# Patient Record
Sex: Male | Born: 1935 | Smoking: Former smoker
Health system: Southern US, Community
[De-identification: ages and names within clinical notes are randomized; demographics above are authoritative.]

## PROBLEM LIST (undated history)

## (undated) DIAGNOSIS — I10 Essential (primary) hypertension: Secondary | ICD-10-CM

## (undated) DIAGNOSIS — I350 Nonrheumatic aortic (valve) stenosis: Secondary | ICD-10-CM

## (undated) DIAGNOSIS — E785 Hyperlipidemia, unspecified: Secondary | ICD-10-CM

## (undated) DIAGNOSIS — E119 Type 2 diabetes mellitus without complications: Secondary | ICD-10-CM

## (undated) DIAGNOSIS — F0390 Unspecified dementia without behavioral disturbance: Secondary | ICD-10-CM

## (undated) DIAGNOSIS — I509 Heart failure, unspecified: Secondary | ICD-10-CM

## (undated) DIAGNOSIS — I4891 Unspecified atrial fibrillation: Secondary | ICD-10-CM

## (undated) HISTORY — PX: APPENDECTOMY: SHX54

## (undated) HISTORY — PX: ELBOW FRACTURE SURGERY: SHX616

## (undated) HISTORY — PX: CHOLECYSTECTOMY: SHX55

---

## 2011-02-08 DIAGNOSIS — L609 Nail disorder, unspecified: Secondary | ICD-10-CM | POA: Diagnosis not present

## 2011-02-08 DIAGNOSIS — M79609 Pain in unspecified limb: Secondary | ICD-10-CM | POA: Diagnosis not present

## 2011-02-09 DIAGNOSIS — E785 Hyperlipidemia, unspecified: Secondary | ICD-10-CM | POA: Diagnosis not present

## 2011-02-09 DIAGNOSIS — E119 Type 2 diabetes mellitus without complications: Secondary | ICD-10-CM | POA: Diagnosis not present

## 2011-02-09 DIAGNOSIS — Z6834 Body mass index (BMI) 34.0-34.9, adult: Secondary | ICD-10-CM | POA: Diagnosis not present

## 2011-02-09 DIAGNOSIS — I10 Essential (primary) hypertension: Secondary | ICD-10-CM | POA: Diagnosis not present

## 2011-02-09 DIAGNOSIS — Z79899 Other long term (current) drug therapy: Secondary | ICD-10-CM | POA: Diagnosis not present

## 2011-04-21 DIAGNOSIS — I4891 Unspecified atrial fibrillation: Secondary | ICD-10-CM | POA: Diagnosis not present

## 2011-04-21 DIAGNOSIS — R0602 Shortness of breath: Secondary | ICD-10-CM | POA: Diagnosis not present

## 2011-04-21 DIAGNOSIS — R0609 Other forms of dyspnea: Secondary | ICD-10-CM | POA: Diagnosis not present

## 2011-04-21 DIAGNOSIS — I509 Heart failure, unspecified: Secondary | ICD-10-CM | POA: Diagnosis not present

## 2011-04-21 DIAGNOSIS — Z6834 Body mass index (BMI) 34.0-34.9, adult: Secondary | ICD-10-CM | POA: Diagnosis not present

## 2011-04-28 DIAGNOSIS — J209 Acute bronchitis, unspecified: Secondary | ICD-10-CM | POA: Diagnosis not present

## 2011-04-28 DIAGNOSIS — Z6834 Body mass index (BMI) 34.0-34.9, adult: Secondary | ICD-10-CM | POA: Diagnosis not present

## 2011-05-03 DIAGNOSIS — M79609 Pain in unspecified limb: Secondary | ICD-10-CM | POA: Diagnosis not present

## 2011-05-03 DIAGNOSIS — L600 Ingrowing nail: Secondary | ICD-10-CM | POA: Diagnosis not present

## 2011-05-27 DIAGNOSIS — E119 Type 2 diabetes mellitus without complications: Secondary | ICD-10-CM | POA: Diagnosis not present

## 2011-05-27 DIAGNOSIS — I10 Essential (primary) hypertension: Secondary | ICD-10-CM | POA: Diagnosis not present

## 2011-05-27 DIAGNOSIS — E785 Hyperlipidemia, unspecified: Secondary | ICD-10-CM | POA: Diagnosis not present

## 2011-05-27 DIAGNOSIS — Z6834 Body mass index (BMI) 34.0-34.9, adult: Secondary | ICD-10-CM | POA: Diagnosis not present

## 2011-08-02 DIAGNOSIS — M79609 Pain in unspecified limb: Secondary | ICD-10-CM | POA: Diagnosis not present

## 2011-08-02 DIAGNOSIS — L600 Ingrowing nail: Secondary | ICD-10-CM | POA: Diagnosis not present

## 2011-09-09 DIAGNOSIS — F0280 Dementia in other diseases classified elsewhere without behavioral disturbance: Secondary | ICD-10-CM | POA: Diagnosis not present

## 2011-09-09 DIAGNOSIS — E785 Hyperlipidemia, unspecified: Secondary | ICD-10-CM | POA: Diagnosis not present

## 2011-09-09 DIAGNOSIS — I10 Essential (primary) hypertension: Secondary | ICD-10-CM | POA: Diagnosis not present

## 2011-09-09 DIAGNOSIS — E119 Type 2 diabetes mellitus without complications: Secondary | ICD-10-CM | POA: Diagnosis not present

## 2011-09-26 DIAGNOSIS — I509 Heart failure, unspecified: Secondary | ICD-10-CM | POA: Diagnosis not present

## 2011-09-26 DIAGNOSIS — E785 Hyperlipidemia, unspecified: Secondary | ICD-10-CM | POA: Diagnosis not present

## 2011-09-26 DIAGNOSIS — E119 Type 2 diabetes mellitus without complications: Secondary | ICD-10-CM | POA: Diagnosis not present

## 2011-09-26 DIAGNOSIS — I10 Essential (primary) hypertension: Secondary | ICD-10-CM | POA: Diagnosis not present

## 2011-10-10 DIAGNOSIS — I509 Heart failure, unspecified: Secondary | ICD-10-CM | POA: Diagnosis not present

## 2011-10-10 DIAGNOSIS — I10 Essential (primary) hypertension: Secondary | ICD-10-CM | POA: Diagnosis not present

## 2011-10-10 DIAGNOSIS — E119 Type 2 diabetes mellitus without complications: Secondary | ICD-10-CM | POA: Diagnosis not present

## 2011-10-10 DIAGNOSIS — Z23 Encounter for immunization: Secondary | ICD-10-CM | POA: Diagnosis not present

## 2011-10-25 DIAGNOSIS — L600 Ingrowing nail: Secondary | ICD-10-CM | POA: Diagnosis not present

## 2011-10-25 DIAGNOSIS — M79609 Pain in unspecified limb: Secondary | ICD-10-CM | POA: Diagnosis not present

## 2011-11-28 DIAGNOSIS — I10 Essential (primary) hypertension: Secondary | ICD-10-CM | POA: Diagnosis not present

## 2011-11-28 DIAGNOSIS — I4891 Unspecified atrial fibrillation: Secondary | ICD-10-CM | POA: Diagnosis not present

## 2011-11-28 DIAGNOSIS — F0390 Unspecified dementia without behavioral disturbance: Secondary | ICD-10-CM | POA: Diagnosis not present

## 2011-11-28 DIAGNOSIS — I509 Heart failure, unspecified: Secondary | ICD-10-CM | POA: Diagnosis not present

## 2011-12-06 DIAGNOSIS — I10 Essential (primary) hypertension: Secondary | ICD-10-CM | POA: Diagnosis not present

## 2011-12-06 DIAGNOSIS — I5022 Chronic systolic (congestive) heart failure: Secondary | ICD-10-CM | POA: Diagnosis not present

## 2011-12-06 DIAGNOSIS — E119 Type 2 diabetes mellitus without complications: Secondary | ICD-10-CM | POA: Diagnosis not present

## 2011-12-06 DIAGNOSIS — I4891 Unspecified atrial fibrillation: Secondary | ICD-10-CM | POA: Diagnosis not present

## 2012-01-10 DIAGNOSIS — F0390 Unspecified dementia without behavioral disturbance: Secondary | ICD-10-CM | POA: Diagnosis not present

## 2012-01-10 DIAGNOSIS — I10 Essential (primary) hypertension: Secondary | ICD-10-CM | POA: Diagnosis not present

## 2012-01-10 DIAGNOSIS — E119 Type 2 diabetes mellitus without complications: Secondary | ICD-10-CM | POA: Diagnosis not present

## 2012-01-10 DIAGNOSIS — E785 Hyperlipidemia, unspecified: Secondary | ICD-10-CM | POA: Diagnosis not present

## 2012-01-23 DIAGNOSIS — I509 Heart failure, unspecified: Secondary | ICD-10-CM | POA: Diagnosis not present

## 2012-01-23 DIAGNOSIS — I4891 Unspecified atrial fibrillation: Secondary | ICD-10-CM | POA: Diagnosis not present

## 2012-01-23 DIAGNOSIS — E119 Type 2 diabetes mellitus without complications: Secondary | ICD-10-CM | POA: Diagnosis not present

## 2012-01-23 DIAGNOSIS — F0390 Unspecified dementia without behavioral disturbance: Secondary | ICD-10-CM | POA: Diagnosis not present

## 2012-01-24 DIAGNOSIS — M79609 Pain in unspecified limb: Secondary | ICD-10-CM | POA: Diagnosis not present

## 2012-01-24 DIAGNOSIS — L600 Ingrowing nail: Secondary | ICD-10-CM | POA: Diagnosis not present

## 2012-02-03 DIAGNOSIS — D485 Neoplasm of uncertain behavior of skin: Secondary | ICD-10-CM | POA: Diagnosis not present

## 2012-04-09 DIAGNOSIS — E785 Hyperlipidemia, unspecified: Secondary | ICD-10-CM | POA: Diagnosis not present

## 2012-04-09 DIAGNOSIS — F0390 Unspecified dementia without behavioral disturbance: Secondary | ICD-10-CM | POA: Diagnosis not present

## 2012-04-09 DIAGNOSIS — I10 Essential (primary) hypertension: Secondary | ICD-10-CM | POA: Diagnosis not present

## 2012-04-09 DIAGNOSIS — E119 Type 2 diabetes mellitus without complications: Secondary | ICD-10-CM | POA: Diagnosis not present

## 2012-06-07 DIAGNOSIS — E785 Hyperlipidemia, unspecified: Secondary | ICD-10-CM | POA: Diagnosis not present

## 2012-06-07 DIAGNOSIS — E119 Type 2 diabetes mellitus without complications: Secondary | ICD-10-CM | POA: Diagnosis not present

## 2012-06-07 DIAGNOSIS — I10 Essential (primary) hypertension: Secondary | ICD-10-CM | POA: Diagnosis not present

## 2012-06-07 DIAGNOSIS — F0390 Unspecified dementia without behavioral disturbance: Secondary | ICD-10-CM | POA: Diagnosis not present

## 2012-06-12 DIAGNOSIS — F0390 Unspecified dementia without behavioral disturbance: Secondary | ICD-10-CM | POA: Diagnosis not present

## 2012-07-16 DIAGNOSIS — I10 Essential (primary) hypertension: Secondary | ICD-10-CM | POA: Diagnosis not present

## 2012-07-16 DIAGNOSIS — E785 Hyperlipidemia, unspecified: Secondary | ICD-10-CM | POA: Diagnosis not present

## 2012-07-16 DIAGNOSIS — E119 Type 2 diabetes mellitus without complications: Secondary | ICD-10-CM | POA: Diagnosis not present

## 2012-07-16 DIAGNOSIS — F0390 Unspecified dementia without behavioral disturbance: Secondary | ICD-10-CM | POA: Diagnosis not present

## 2012-08-07 DIAGNOSIS — L600 Ingrowing nail: Secondary | ICD-10-CM | POA: Diagnosis not present

## 2012-08-07 DIAGNOSIS — M79609 Pain in unspecified limb: Secondary | ICD-10-CM | POA: Diagnosis not present

## 2012-09-10 DIAGNOSIS — E119 Type 2 diabetes mellitus without complications: Secondary | ICD-10-CM | POA: Diagnosis not present

## 2012-09-19 DIAGNOSIS — E785 Hyperlipidemia, unspecified: Secondary | ICD-10-CM | POA: Diagnosis not present

## 2012-09-19 DIAGNOSIS — I10 Essential (primary) hypertension: Secondary | ICD-10-CM | POA: Diagnosis not present

## 2012-09-19 DIAGNOSIS — Z23 Encounter for immunization: Secondary | ICD-10-CM | POA: Diagnosis not present

## 2012-09-19 DIAGNOSIS — D518 Other vitamin B12 deficiency anemias: Secondary | ICD-10-CM | POA: Diagnosis not present

## 2012-09-19 DIAGNOSIS — E119 Type 2 diabetes mellitus without complications: Secondary | ICD-10-CM | POA: Diagnosis not present

## 2012-09-21 DIAGNOSIS — I10 Essential (primary) hypertension: Secondary | ICD-10-CM | POA: Diagnosis not present

## 2012-09-21 DIAGNOSIS — I509 Heart failure, unspecified: Secondary | ICD-10-CM | POA: Diagnosis not present

## 2012-09-21 DIAGNOSIS — E119 Type 2 diabetes mellitus without complications: Secondary | ICD-10-CM | POA: Diagnosis not present

## 2012-09-21 DIAGNOSIS — D510 Vitamin B12 deficiency anemia due to intrinsic factor deficiency: Secondary | ICD-10-CM | POA: Diagnosis not present

## 2012-11-06 DIAGNOSIS — M79609 Pain in unspecified limb: Secondary | ICD-10-CM | POA: Diagnosis not present

## 2012-11-06 DIAGNOSIS — L600 Ingrowing nail: Secondary | ICD-10-CM | POA: Diagnosis not present

## 2012-11-19 DIAGNOSIS — F0390 Unspecified dementia without behavioral disturbance: Secondary | ICD-10-CM | POA: Diagnosis not present

## 2012-11-19 DIAGNOSIS — I10 Essential (primary) hypertension: Secondary | ICD-10-CM | POA: Diagnosis not present

## 2012-11-20 DIAGNOSIS — I509 Heart failure, unspecified: Secondary | ICD-10-CM | POA: Diagnosis not present

## 2012-11-20 DIAGNOSIS — D510 Vitamin B12 deficiency anemia due to intrinsic factor deficiency: Secondary | ICD-10-CM | POA: Diagnosis not present

## 2012-11-20 DIAGNOSIS — I10 Essential (primary) hypertension: Secondary | ICD-10-CM | POA: Diagnosis not present

## 2012-11-20 DIAGNOSIS — E119 Type 2 diabetes mellitus without complications: Secondary | ICD-10-CM | POA: Diagnosis not present

## 2012-12-11 DIAGNOSIS — S42213A Unspecified displaced fracture of surgical neck of unspecified humerus, initial encounter for closed fracture: Secondary | ICD-10-CM | POA: Diagnosis not present

## 2012-12-11 DIAGNOSIS — R609 Edema, unspecified: Secondary | ICD-10-CM | POA: Diagnosis not present

## 2012-12-11 DIAGNOSIS — S52009A Unspecified fracture of upper end of unspecified ulna, initial encounter for closed fracture: Secondary | ICD-10-CM | POA: Diagnosis not present

## 2012-12-11 DIAGNOSIS — W010XXA Fall on same level from slipping, tripping and stumbling without subsequent striking against object, initial encounter: Secondary | ICD-10-CM | POA: Diagnosis not present

## 2012-12-11 DIAGNOSIS — S42409A Unspecified fracture of lower end of unspecified humerus, initial encounter for closed fracture: Secondary | ICD-10-CM | POA: Diagnosis not present

## 2012-12-11 DIAGNOSIS — M79609 Pain in unspecified limb: Secondary | ICD-10-CM | POA: Diagnosis not present

## 2012-12-12 DIAGNOSIS — S52023A Displaced fracture of olecranon process without intraarticular extension of unspecified ulna, initial encounter for closed fracture: Secondary | ICD-10-CM | POA: Diagnosis not present

## 2012-12-12 DIAGNOSIS — E119 Type 2 diabetes mellitus without complications: Secondary | ICD-10-CM | POA: Diagnosis not present

## 2012-12-13 DIAGNOSIS — F0390 Unspecified dementia without behavioral disturbance: Secondary | ICD-10-CM | POA: Diagnosis not present

## 2012-12-13 DIAGNOSIS — S52023A Displaced fracture of olecranon process without intraarticular extension of unspecified ulna, initial encounter for closed fracture: Secondary | ICD-10-CM | POA: Diagnosis not present

## 2012-12-24 DIAGNOSIS — F0390 Unspecified dementia without behavioral disturbance: Secondary | ICD-10-CM | POA: Diagnosis not present

## 2012-12-24 DIAGNOSIS — R0989 Other specified symptoms and signs involving the circulatory and respiratory systems: Secondary | ICD-10-CM | POA: Diagnosis not present

## 2012-12-24 DIAGNOSIS — I10 Essential (primary) hypertension: Secondary | ICD-10-CM | POA: Diagnosis not present

## 2012-12-24 DIAGNOSIS — R0609 Other forms of dyspnea: Secondary | ICD-10-CM | POA: Diagnosis not present

## 2012-12-24 DIAGNOSIS — E785 Hyperlipidemia, unspecified: Secondary | ICD-10-CM | POA: Diagnosis not present

## 2012-12-24 DIAGNOSIS — I509 Heart failure, unspecified: Secondary | ICD-10-CM | POA: Diagnosis not present

## 2012-12-24 DIAGNOSIS — E119 Type 2 diabetes mellitus without complications: Secondary | ICD-10-CM | POA: Diagnosis not present

## 2012-12-26 DIAGNOSIS — I10 Essential (primary) hypertension: Secondary | ICD-10-CM | POA: Diagnosis not present

## 2012-12-26 DIAGNOSIS — I509 Heart failure, unspecified: Secondary | ICD-10-CM | POA: Diagnosis not present

## 2012-12-28 DIAGNOSIS — I10 Essential (primary) hypertension: Secondary | ICD-10-CM | POA: Diagnosis not present

## 2012-12-31 DIAGNOSIS — I10 Essential (primary) hypertension: Secondary | ICD-10-CM | POA: Diagnosis not present

## 2012-12-31 DIAGNOSIS — I509 Heart failure, unspecified: Secondary | ICD-10-CM | POA: Diagnosis not present

## 2013-01-04 DIAGNOSIS — I509 Heart failure, unspecified: Secondary | ICD-10-CM | POA: Diagnosis not present

## 2013-01-04 DIAGNOSIS — I10 Essential (primary) hypertension: Secondary | ICD-10-CM | POA: Diagnosis not present

## 2013-01-05 DIAGNOSIS — E875 Hyperkalemia: Secondary | ICD-10-CM | POA: Diagnosis not present

## 2013-01-07 DIAGNOSIS — S52123A Displaced fracture of head of unspecified radius, initial encounter for closed fracture: Secondary | ICD-10-CM | POA: Diagnosis not present

## 2013-01-07 DIAGNOSIS — S52023A Displaced fracture of olecranon process without intraarticular extension of unspecified ulna, initial encounter for closed fracture: Secondary | ICD-10-CM | POA: Diagnosis not present

## 2013-01-08 DIAGNOSIS — G20 Parkinson's disease: Secondary | ICD-10-CM | POA: Diagnosis not present

## 2013-01-08 DIAGNOSIS — E119 Type 2 diabetes mellitus without complications: Secondary | ICD-10-CM | POA: Diagnosis not present

## 2013-01-08 DIAGNOSIS — I4891 Unspecified atrial fibrillation: Secondary | ICD-10-CM | POA: Diagnosis not present

## 2013-01-08 DIAGNOSIS — R9431 Abnormal electrocardiogram [ECG] [EKG]: Secondary | ICD-10-CM | POA: Diagnosis not present

## 2013-01-08 DIAGNOSIS — Z7982 Long term (current) use of aspirin: Secondary | ICD-10-CM | POA: Diagnosis not present

## 2013-01-08 DIAGNOSIS — F0390 Unspecified dementia without behavioral disturbance: Secondary | ICD-10-CM | POA: Diagnosis not present

## 2013-01-08 DIAGNOSIS — S52023A Displaced fracture of olecranon process without intraarticular extension of unspecified ulna, initial encounter for closed fracture: Secondary | ICD-10-CM | POA: Diagnosis not present

## 2013-01-09 DIAGNOSIS — S52023A Displaced fracture of olecranon process without intraarticular extension of unspecified ulna, initial encounter for closed fracture: Secondary | ICD-10-CM | POA: Diagnosis not present

## 2013-01-09 DIAGNOSIS — S52133A Displaced fracture of neck of unspecified radius, initial encounter for closed fracture: Secondary | ICD-10-CM | POA: Diagnosis not present

## 2013-01-09 DIAGNOSIS — I4891 Unspecified atrial fibrillation: Secondary | ICD-10-CM | POA: Diagnosis not present

## 2013-01-09 DIAGNOSIS — G20 Parkinson's disease: Secondary | ICD-10-CM | POA: Diagnosis not present

## 2013-01-09 DIAGNOSIS — E119 Type 2 diabetes mellitus without complications: Secondary | ICD-10-CM | POA: Diagnosis not present

## 2013-01-09 DIAGNOSIS — Z7982 Long term (current) use of aspirin: Secondary | ICD-10-CM | POA: Diagnosis not present

## 2013-01-09 DIAGNOSIS — S52123A Displaced fracture of head of unspecified radius, initial encounter for closed fracture: Secondary | ICD-10-CM | POA: Diagnosis not present

## 2013-01-09 DIAGNOSIS — F0390 Unspecified dementia without behavioral disturbance: Secondary | ICD-10-CM | POA: Diagnosis not present

## 2013-01-10 DIAGNOSIS — L2089 Other atopic dermatitis: Secondary | ICD-10-CM | POA: Diagnosis not present

## 2013-01-10 DIAGNOSIS — F0390 Unspecified dementia without behavioral disturbance: Secondary | ICD-10-CM | POA: Diagnosis not present

## 2013-01-10 DIAGNOSIS — L299 Pruritus, unspecified: Secondary | ICD-10-CM | POA: Diagnosis not present

## 2013-01-10 DIAGNOSIS — S52023A Displaced fracture of olecranon process without intraarticular extension of unspecified ulna, initial encounter for closed fracture: Secondary | ICD-10-CM | POA: Diagnosis not present

## 2013-01-10 DIAGNOSIS — IMO0002 Reserved for concepts with insufficient information to code with codable children: Secondary | ICD-10-CM | POA: Diagnosis not present

## 2013-01-19 DIAGNOSIS — E119 Type 2 diabetes mellitus without complications: Secondary | ICD-10-CM | POA: Diagnosis not present

## 2013-01-19 DIAGNOSIS — D510 Vitamin B12 deficiency anemia due to intrinsic factor deficiency: Secondary | ICD-10-CM | POA: Diagnosis not present

## 2013-01-19 DIAGNOSIS — I509 Heart failure, unspecified: Secondary | ICD-10-CM | POA: Diagnosis not present

## 2013-01-19 DIAGNOSIS — I10 Essential (primary) hypertension: Secondary | ICD-10-CM | POA: Diagnosis not present

## 2013-01-19 DIAGNOSIS — IMO0001 Reserved for inherently not codable concepts without codable children: Secondary | ICD-10-CM | POA: Diagnosis not present

## 2013-01-21 DIAGNOSIS — S52023A Displaced fracture of olecranon process without intraarticular extension of unspecified ulna, initial encounter for closed fracture: Secondary | ICD-10-CM | POA: Diagnosis not present

## 2013-01-22 DIAGNOSIS — I10 Essential (primary) hypertension: Secondary | ICD-10-CM | POA: Diagnosis not present

## 2013-01-22 DIAGNOSIS — E119 Type 2 diabetes mellitus without complications: Secondary | ICD-10-CM | POA: Diagnosis not present

## 2013-01-22 DIAGNOSIS — IMO0001 Reserved for inherently not codable concepts without codable children: Secondary | ICD-10-CM | POA: Diagnosis not present

## 2013-01-22 DIAGNOSIS — D510 Vitamin B12 deficiency anemia due to intrinsic factor deficiency: Secondary | ICD-10-CM | POA: Diagnosis not present

## 2013-01-22 DIAGNOSIS — I509 Heart failure, unspecified: Secondary | ICD-10-CM | POA: Diagnosis not present

## 2013-01-23 DIAGNOSIS — D510 Vitamin B12 deficiency anemia due to intrinsic factor deficiency: Secondary | ICD-10-CM | POA: Diagnosis not present

## 2013-01-23 DIAGNOSIS — IMO0001 Reserved for inherently not codable concepts without codable children: Secondary | ICD-10-CM | POA: Diagnosis not present

## 2013-01-23 DIAGNOSIS — I10 Essential (primary) hypertension: Secondary | ICD-10-CM | POA: Diagnosis not present

## 2013-01-23 DIAGNOSIS — I509 Heart failure, unspecified: Secondary | ICD-10-CM | POA: Diagnosis not present

## 2013-01-23 DIAGNOSIS — E119 Type 2 diabetes mellitus without complications: Secondary | ICD-10-CM | POA: Diagnosis not present

## 2013-01-24 DIAGNOSIS — I10 Essential (primary) hypertension: Secondary | ICD-10-CM | POA: Diagnosis not present

## 2013-01-24 DIAGNOSIS — E119 Type 2 diabetes mellitus without complications: Secondary | ICD-10-CM | POA: Diagnosis not present

## 2013-01-24 DIAGNOSIS — D510 Vitamin B12 deficiency anemia due to intrinsic factor deficiency: Secondary | ICD-10-CM | POA: Diagnosis not present

## 2013-01-24 DIAGNOSIS — IMO0001 Reserved for inherently not codable concepts without codable children: Secondary | ICD-10-CM | POA: Diagnosis not present

## 2013-01-24 DIAGNOSIS — I509 Heart failure, unspecified: Secondary | ICD-10-CM | POA: Diagnosis not present

## 2013-01-28 DIAGNOSIS — D510 Vitamin B12 deficiency anemia due to intrinsic factor deficiency: Secondary | ICD-10-CM | POA: Diagnosis not present

## 2013-01-28 DIAGNOSIS — I509 Heart failure, unspecified: Secondary | ICD-10-CM | POA: Diagnosis not present

## 2013-01-28 DIAGNOSIS — E119 Type 2 diabetes mellitus without complications: Secondary | ICD-10-CM | POA: Diagnosis not present

## 2013-01-28 DIAGNOSIS — I10 Essential (primary) hypertension: Secondary | ICD-10-CM | POA: Diagnosis not present

## 2013-01-28 DIAGNOSIS — IMO0001 Reserved for inherently not codable concepts without codable children: Secondary | ICD-10-CM | POA: Diagnosis not present

## 2013-01-29 DIAGNOSIS — I509 Heart failure, unspecified: Secondary | ICD-10-CM | POA: Diagnosis not present

## 2013-01-29 DIAGNOSIS — E119 Type 2 diabetes mellitus without complications: Secondary | ICD-10-CM | POA: Diagnosis not present

## 2013-01-29 DIAGNOSIS — I10 Essential (primary) hypertension: Secondary | ICD-10-CM | POA: Diagnosis not present

## 2013-01-29 DIAGNOSIS — IMO0001 Reserved for inherently not codable concepts without codable children: Secondary | ICD-10-CM | POA: Diagnosis not present

## 2013-01-29 DIAGNOSIS — D510 Vitamin B12 deficiency anemia due to intrinsic factor deficiency: Secondary | ICD-10-CM | POA: Diagnosis not present

## 2013-01-30 DIAGNOSIS — IMO0001 Reserved for inherently not codable concepts without codable children: Secondary | ICD-10-CM | POA: Diagnosis not present

## 2013-01-30 DIAGNOSIS — D510 Vitamin B12 deficiency anemia due to intrinsic factor deficiency: Secondary | ICD-10-CM | POA: Diagnosis not present

## 2013-01-30 DIAGNOSIS — E119 Type 2 diabetes mellitus without complications: Secondary | ICD-10-CM | POA: Diagnosis not present

## 2013-01-30 DIAGNOSIS — I10 Essential (primary) hypertension: Secondary | ICD-10-CM | POA: Diagnosis not present

## 2013-01-30 DIAGNOSIS — I509 Heart failure, unspecified: Secondary | ICD-10-CM | POA: Diagnosis not present

## 2013-01-31 DIAGNOSIS — IMO0001 Reserved for inherently not codable concepts without codable children: Secondary | ICD-10-CM | POA: Diagnosis not present

## 2013-01-31 DIAGNOSIS — I10 Essential (primary) hypertension: Secondary | ICD-10-CM | POA: Diagnosis not present

## 2013-01-31 DIAGNOSIS — E119 Type 2 diabetes mellitus without complications: Secondary | ICD-10-CM | POA: Diagnosis not present

## 2013-01-31 DIAGNOSIS — D510 Vitamin B12 deficiency anemia due to intrinsic factor deficiency: Secondary | ICD-10-CM | POA: Diagnosis not present

## 2013-01-31 DIAGNOSIS — I509 Heart failure, unspecified: Secondary | ICD-10-CM | POA: Diagnosis not present

## 2013-02-05 DIAGNOSIS — E119 Type 2 diabetes mellitus without complications: Secondary | ICD-10-CM | POA: Diagnosis not present

## 2013-02-05 DIAGNOSIS — D510 Vitamin B12 deficiency anemia due to intrinsic factor deficiency: Secondary | ICD-10-CM | POA: Diagnosis not present

## 2013-02-05 DIAGNOSIS — I10 Essential (primary) hypertension: Secondary | ICD-10-CM | POA: Diagnosis not present

## 2013-02-05 DIAGNOSIS — M79609 Pain in unspecified limb: Secondary | ICD-10-CM | POA: Diagnosis not present

## 2013-02-05 DIAGNOSIS — I509 Heart failure, unspecified: Secondary | ICD-10-CM | POA: Diagnosis not present

## 2013-02-05 DIAGNOSIS — L600 Ingrowing nail: Secondary | ICD-10-CM | POA: Diagnosis not present

## 2013-02-05 DIAGNOSIS — IMO0001 Reserved for inherently not codable concepts without codable children: Secondary | ICD-10-CM | POA: Diagnosis not present

## 2013-02-07 DIAGNOSIS — I359 Nonrheumatic aortic valve disorder, unspecified: Secondary | ICD-10-CM | POA: Diagnosis not present

## 2013-02-07 DIAGNOSIS — D510 Vitamin B12 deficiency anemia due to intrinsic factor deficiency: Secondary | ICD-10-CM | POA: Diagnosis not present

## 2013-02-07 DIAGNOSIS — IMO0001 Reserved for inherently not codable concepts without codable children: Secondary | ICD-10-CM | POA: Diagnosis not present

## 2013-02-07 DIAGNOSIS — L259 Unspecified contact dermatitis, unspecified cause: Secondary | ICD-10-CM | POA: Diagnosis not present

## 2013-02-07 DIAGNOSIS — E785 Hyperlipidemia, unspecified: Secondary | ICD-10-CM | POA: Diagnosis not present

## 2013-02-07 DIAGNOSIS — L981 Factitial dermatitis: Secondary | ICD-10-CM | POA: Diagnosis not present

## 2013-02-07 DIAGNOSIS — E119 Type 2 diabetes mellitus without complications: Secondary | ICD-10-CM | POA: Diagnosis not present

## 2013-02-07 DIAGNOSIS — I10 Essential (primary) hypertension: Secondary | ICD-10-CM | POA: Diagnosis not present

## 2013-02-07 DIAGNOSIS — I509 Heart failure, unspecified: Secondary | ICD-10-CM | POA: Diagnosis not present

## 2013-02-11 DIAGNOSIS — IMO0001 Reserved for inherently not codable concepts without codable children: Secondary | ICD-10-CM | POA: Diagnosis not present

## 2013-02-11 DIAGNOSIS — E119 Type 2 diabetes mellitus without complications: Secondary | ICD-10-CM | POA: Diagnosis not present

## 2013-02-11 DIAGNOSIS — I509 Heart failure, unspecified: Secondary | ICD-10-CM | POA: Diagnosis not present

## 2013-02-11 DIAGNOSIS — D510 Vitamin B12 deficiency anemia due to intrinsic factor deficiency: Secondary | ICD-10-CM | POA: Diagnosis not present

## 2013-02-11 DIAGNOSIS — I10 Essential (primary) hypertension: Secondary | ICD-10-CM | POA: Diagnosis not present

## 2013-02-12 DIAGNOSIS — IMO0001 Reserved for inherently not codable concepts without codable children: Secondary | ICD-10-CM | POA: Diagnosis not present

## 2013-02-12 DIAGNOSIS — E119 Type 2 diabetes mellitus without complications: Secondary | ICD-10-CM | POA: Diagnosis not present

## 2013-02-12 DIAGNOSIS — D510 Vitamin B12 deficiency anemia due to intrinsic factor deficiency: Secondary | ICD-10-CM | POA: Diagnosis not present

## 2013-02-12 DIAGNOSIS — I509 Heart failure, unspecified: Secondary | ICD-10-CM | POA: Diagnosis not present

## 2013-02-12 DIAGNOSIS — I10 Essential (primary) hypertension: Secondary | ICD-10-CM | POA: Diagnosis not present

## 2013-02-14 DIAGNOSIS — IMO0001 Reserved for inherently not codable concepts without codable children: Secondary | ICD-10-CM | POA: Diagnosis not present

## 2013-02-14 DIAGNOSIS — E119 Type 2 diabetes mellitus without complications: Secondary | ICD-10-CM | POA: Diagnosis not present

## 2013-02-14 DIAGNOSIS — D510 Vitamin B12 deficiency anemia due to intrinsic factor deficiency: Secondary | ICD-10-CM | POA: Diagnosis not present

## 2013-02-14 DIAGNOSIS — I10 Essential (primary) hypertension: Secondary | ICD-10-CM | POA: Diagnosis not present

## 2013-02-14 DIAGNOSIS — I509 Heart failure, unspecified: Secondary | ICD-10-CM | POA: Diagnosis not present

## 2013-02-17 DIAGNOSIS — D510 Vitamin B12 deficiency anemia due to intrinsic factor deficiency: Secondary | ICD-10-CM | POA: Diagnosis not present

## 2013-02-17 DIAGNOSIS — I10 Essential (primary) hypertension: Secondary | ICD-10-CM | POA: Diagnosis not present

## 2013-02-17 DIAGNOSIS — E119 Type 2 diabetes mellitus without complications: Secondary | ICD-10-CM | POA: Diagnosis not present

## 2013-02-17 DIAGNOSIS — I509 Heart failure, unspecified: Secondary | ICD-10-CM | POA: Diagnosis not present

## 2013-02-17 DIAGNOSIS — IMO0001 Reserved for inherently not codable concepts without codable children: Secondary | ICD-10-CM | POA: Diagnosis not present

## 2013-02-18 DIAGNOSIS — I509 Heart failure, unspecified: Secondary | ICD-10-CM | POA: Diagnosis not present

## 2013-02-18 DIAGNOSIS — IMO0001 Reserved for inherently not codable concepts without codable children: Secondary | ICD-10-CM | POA: Diagnosis not present

## 2013-02-18 DIAGNOSIS — E119 Type 2 diabetes mellitus without complications: Secondary | ICD-10-CM | POA: Diagnosis not present

## 2013-02-18 DIAGNOSIS — D510 Vitamin B12 deficiency anemia due to intrinsic factor deficiency: Secondary | ICD-10-CM | POA: Diagnosis not present

## 2013-02-18 DIAGNOSIS — S52023A Displaced fracture of olecranon process without intraarticular extension of unspecified ulna, initial encounter for closed fracture: Secondary | ICD-10-CM | POA: Diagnosis not present

## 2013-02-18 DIAGNOSIS — I10 Essential (primary) hypertension: Secondary | ICD-10-CM | POA: Diagnosis not present

## 2013-02-20 DIAGNOSIS — I10 Essential (primary) hypertension: Secondary | ICD-10-CM | POA: Diagnosis not present

## 2013-02-20 DIAGNOSIS — F0390 Unspecified dementia without behavioral disturbance: Secondary | ICD-10-CM | POA: Diagnosis not present

## 2013-02-21 DIAGNOSIS — D510 Vitamin B12 deficiency anemia due to intrinsic factor deficiency: Secondary | ICD-10-CM | POA: Diagnosis not present

## 2013-02-21 DIAGNOSIS — IMO0001 Reserved for inherently not codable concepts without codable children: Secondary | ICD-10-CM | POA: Diagnosis not present

## 2013-02-21 DIAGNOSIS — I10 Essential (primary) hypertension: Secondary | ICD-10-CM | POA: Diagnosis not present

## 2013-02-21 DIAGNOSIS — I509 Heart failure, unspecified: Secondary | ICD-10-CM | POA: Diagnosis not present

## 2013-02-21 DIAGNOSIS — E119 Type 2 diabetes mellitus without complications: Secondary | ICD-10-CM | POA: Diagnosis not present

## 2013-02-25 DIAGNOSIS — I10 Essential (primary) hypertension: Secondary | ICD-10-CM | POA: Diagnosis not present

## 2013-02-25 DIAGNOSIS — E119 Type 2 diabetes mellitus without complications: Secondary | ICD-10-CM | POA: Diagnosis not present

## 2013-02-25 DIAGNOSIS — I509 Heart failure, unspecified: Secondary | ICD-10-CM | POA: Diagnosis not present

## 2013-02-25 DIAGNOSIS — D510 Vitamin B12 deficiency anemia due to intrinsic factor deficiency: Secondary | ICD-10-CM | POA: Diagnosis not present

## 2013-02-25 DIAGNOSIS — IMO0001 Reserved for inherently not codable concepts without codable children: Secondary | ICD-10-CM | POA: Diagnosis not present

## 2013-02-26 DIAGNOSIS — E119 Type 2 diabetes mellitus without complications: Secondary | ICD-10-CM | POA: Diagnosis not present

## 2013-02-26 DIAGNOSIS — I10 Essential (primary) hypertension: Secondary | ICD-10-CM | POA: Diagnosis not present

## 2013-02-26 DIAGNOSIS — IMO0001 Reserved for inherently not codable concepts without codable children: Secondary | ICD-10-CM | POA: Diagnosis not present

## 2013-02-26 DIAGNOSIS — D510 Vitamin B12 deficiency anemia due to intrinsic factor deficiency: Secondary | ICD-10-CM | POA: Diagnosis not present

## 2013-02-26 DIAGNOSIS — I509 Heart failure, unspecified: Secondary | ICD-10-CM | POA: Diagnosis not present

## 2013-03-01 DIAGNOSIS — D510 Vitamin B12 deficiency anemia due to intrinsic factor deficiency: Secondary | ICD-10-CM | POA: Diagnosis not present

## 2013-03-01 DIAGNOSIS — IMO0001 Reserved for inherently not codable concepts without codable children: Secondary | ICD-10-CM | POA: Diagnosis not present

## 2013-03-01 DIAGNOSIS — E119 Type 2 diabetes mellitus without complications: Secondary | ICD-10-CM | POA: Diagnosis not present

## 2013-03-01 DIAGNOSIS — I509 Heart failure, unspecified: Secondary | ICD-10-CM | POA: Diagnosis not present

## 2013-03-01 DIAGNOSIS — I10 Essential (primary) hypertension: Secondary | ICD-10-CM | POA: Diagnosis not present

## 2013-03-05 DIAGNOSIS — I509 Heart failure, unspecified: Secondary | ICD-10-CM | POA: Diagnosis not present

## 2013-03-05 DIAGNOSIS — IMO0001 Reserved for inherently not codable concepts without codable children: Secondary | ICD-10-CM | POA: Diagnosis not present

## 2013-03-05 DIAGNOSIS — I10 Essential (primary) hypertension: Secondary | ICD-10-CM | POA: Diagnosis not present

## 2013-03-05 DIAGNOSIS — E119 Type 2 diabetes mellitus without complications: Secondary | ICD-10-CM | POA: Diagnosis not present

## 2013-03-05 DIAGNOSIS — D510 Vitamin B12 deficiency anemia due to intrinsic factor deficiency: Secondary | ICD-10-CM | POA: Diagnosis not present

## 2013-03-07 DIAGNOSIS — IMO0001 Reserved for inherently not codable concepts without codable children: Secondary | ICD-10-CM | POA: Diagnosis not present

## 2013-03-07 DIAGNOSIS — E119 Type 2 diabetes mellitus without complications: Secondary | ICD-10-CM | POA: Diagnosis not present

## 2013-03-07 DIAGNOSIS — I509 Heart failure, unspecified: Secondary | ICD-10-CM | POA: Diagnosis not present

## 2013-03-07 DIAGNOSIS — D510 Vitamin B12 deficiency anemia due to intrinsic factor deficiency: Secondary | ICD-10-CM | POA: Diagnosis not present

## 2013-03-07 DIAGNOSIS — I10 Essential (primary) hypertension: Secondary | ICD-10-CM | POA: Diagnosis not present

## 2013-03-12 DIAGNOSIS — E119 Type 2 diabetes mellitus without complications: Secondary | ICD-10-CM | POA: Diagnosis not present

## 2013-03-12 DIAGNOSIS — I10 Essential (primary) hypertension: Secondary | ICD-10-CM | POA: Diagnosis not present

## 2013-03-12 DIAGNOSIS — I509 Heart failure, unspecified: Secondary | ICD-10-CM | POA: Diagnosis not present

## 2013-03-12 DIAGNOSIS — IMO0001 Reserved for inherently not codable concepts without codable children: Secondary | ICD-10-CM | POA: Diagnosis not present

## 2013-03-12 DIAGNOSIS — D510 Vitamin B12 deficiency anemia due to intrinsic factor deficiency: Secondary | ICD-10-CM | POA: Diagnosis not present

## 2013-03-13 DIAGNOSIS — I509 Heart failure, unspecified: Secondary | ICD-10-CM | POA: Diagnosis not present

## 2013-03-13 DIAGNOSIS — IMO0001 Reserved for inherently not codable concepts without codable children: Secondary | ICD-10-CM | POA: Diagnosis not present

## 2013-03-13 DIAGNOSIS — D510 Vitamin B12 deficiency anemia due to intrinsic factor deficiency: Secondary | ICD-10-CM | POA: Diagnosis not present

## 2013-03-13 DIAGNOSIS — I10 Essential (primary) hypertension: Secondary | ICD-10-CM | POA: Diagnosis not present

## 2013-03-13 DIAGNOSIS — E119 Type 2 diabetes mellitus without complications: Secondary | ICD-10-CM | POA: Diagnosis not present

## 2013-03-14 DIAGNOSIS — I509 Heart failure, unspecified: Secondary | ICD-10-CM | POA: Diagnosis not present

## 2013-03-14 DIAGNOSIS — IMO0001 Reserved for inherently not codable concepts without codable children: Secondary | ICD-10-CM | POA: Diagnosis not present

## 2013-03-14 DIAGNOSIS — I10 Essential (primary) hypertension: Secondary | ICD-10-CM | POA: Diagnosis not present

## 2013-03-14 DIAGNOSIS — D510 Vitamin B12 deficiency anemia due to intrinsic factor deficiency: Secondary | ICD-10-CM | POA: Diagnosis not present

## 2013-03-14 DIAGNOSIS — E119 Type 2 diabetes mellitus without complications: Secondary | ICD-10-CM | POA: Diagnosis not present

## 2013-03-18 DIAGNOSIS — I509 Heart failure, unspecified: Secondary | ICD-10-CM | POA: Diagnosis not present

## 2013-03-18 DIAGNOSIS — D510 Vitamin B12 deficiency anemia due to intrinsic factor deficiency: Secondary | ICD-10-CM | POA: Diagnosis not present

## 2013-03-18 DIAGNOSIS — IMO0001 Reserved for inherently not codable concepts without codable children: Secondary | ICD-10-CM | POA: Diagnosis not present

## 2013-03-18 DIAGNOSIS — E119 Type 2 diabetes mellitus without complications: Secondary | ICD-10-CM | POA: Diagnosis not present

## 2013-03-18 DIAGNOSIS — I10 Essential (primary) hypertension: Secondary | ICD-10-CM | POA: Diagnosis not present

## 2013-03-19 DIAGNOSIS — S52123A Displaced fracture of head of unspecified radius, initial encounter for closed fracture: Secondary | ICD-10-CM | POA: Diagnosis not present

## 2013-03-19 DIAGNOSIS — S52023A Displaced fracture of olecranon process without intraarticular extension of unspecified ulna, initial encounter for closed fracture: Secondary | ICD-10-CM | POA: Diagnosis not present

## 2013-03-20 DIAGNOSIS — I10 Essential (primary) hypertension: Secondary | ICD-10-CM | POA: Diagnosis not present

## 2013-03-20 DIAGNOSIS — IMO0001 Reserved for inherently not codable concepts without codable children: Secondary | ICD-10-CM | POA: Diagnosis not present

## 2013-03-20 DIAGNOSIS — D510 Vitamin B12 deficiency anemia due to intrinsic factor deficiency: Secondary | ICD-10-CM | POA: Diagnosis not present

## 2013-03-20 DIAGNOSIS — E119 Type 2 diabetes mellitus without complications: Secondary | ICD-10-CM | POA: Diagnosis not present

## 2013-03-20 DIAGNOSIS — I509 Heart failure, unspecified: Secondary | ICD-10-CM | POA: Diagnosis not present

## 2013-04-04 DIAGNOSIS — L299 Pruritus, unspecified: Secondary | ICD-10-CM | POA: Diagnosis not present

## 2013-04-04 DIAGNOSIS — L2089 Other atopic dermatitis: Secondary | ICD-10-CM | POA: Diagnosis not present

## 2013-04-04 DIAGNOSIS — F0390 Unspecified dementia without behavioral disturbance: Secondary | ICD-10-CM | POA: Diagnosis not present

## 2013-04-11 DIAGNOSIS — L259 Unspecified contact dermatitis, unspecified cause: Secondary | ICD-10-CM | POA: Diagnosis not present

## 2013-04-11 DIAGNOSIS — L981 Factitial dermatitis: Secondary | ICD-10-CM | POA: Diagnosis not present

## 2013-05-03 DIAGNOSIS — B86 Scabies: Secondary | ICD-10-CM | POA: Diagnosis not present

## 2013-05-07 DIAGNOSIS — L600 Ingrowing nail: Secondary | ICD-10-CM | POA: Diagnosis not present

## 2013-05-07 DIAGNOSIS — M79609 Pain in unspecified limb: Secondary | ICD-10-CM | POA: Diagnosis not present

## 2013-05-09 DIAGNOSIS — F0390 Unspecified dementia without behavioral disturbance: Secondary | ICD-10-CM | POA: Diagnosis not present

## 2013-05-09 DIAGNOSIS — E785 Hyperlipidemia, unspecified: Secondary | ICD-10-CM | POA: Diagnosis not present

## 2013-05-09 DIAGNOSIS — E119 Type 2 diabetes mellitus without complications: Secondary | ICD-10-CM | POA: Diagnosis not present

## 2013-05-09 DIAGNOSIS — I10 Essential (primary) hypertension: Secondary | ICD-10-CM | POA: Diagnosis not present

## 2013-05-14 DIAGNOSIS — E119 Type 2 diabetes mellitus without complications: Secondary | ICD-10-CM | POA: Diagnosis not present

## 2013-05-14 DIAGNOSIS — S52023A Displaced fracture of olecranon process without intraarticular extension of unspecified ulna, initial encounter for closed fracture: Secondary | ICD-10-CM | POA: Diagnosis not present

## 2013-05-14 DIAGNOSIS — I10 Essential (primary) hypertension: Secondary | ICD-10-CM | POA: Diagnosis not present

## 2013-05-14 DIAGNOSIS — I509 Heart failure, unspecified: Secondary | ICD-10-CM | POA: Diagnosis not present

## 2013-05-14 DIAGNOSIS — D510 Vitamin B12 deficiency anemia due to intrinsic factor deficiency: Secondary | ICD-10-CM | POA: Diagnosis not present

## 2013-05-14 DIAGNOSIS — S52123A Displaced fracture of head of unspecified radius, initial encounter for closed fracture: Secondary | ICD-10-CM | POA: Diagnosis not present

## 2013-05-16 DIAGNOSIS — G309 Alzheimer's disease, unspecified: Secondary | ICD-10-CM | POA: Diagnosis not present

## 2013-05-16 DIAGNOSIS — F0280 Dementia in other diseases classified elsewhere without behavioral disturbance: Secondary | ICD-10-CM | POA: Diagnosis not present

## 2013-05-16 DIAGNOSIS — E559 Vitamin D deficiency, unspecified: Secondary | ICD-10-CM | POA: Diagnosis not present

## 2013-05-17 DIAGNOSIS — B86 Scabies: Secondary | ICD-10-CM | POA: Diagnosis not present

## 2013-05-19 DIAGNOSIS — D510 Vitamin B12 deficiency anemia due to intrinsic factor deficiency: Secondary | ICD-10-CM | POA: Diagnosis not present

## 2013-05-19 DIAGNOSIS — I509 Heart failure, unspecified: Secondary | ICD-10-CM | POA: Diagnosis not present

## 2013-05-19 DIAGNOSIS — I10 Essential (primary) hypertension: Secondary | ICD-10-CM | POA: Diagnosis not present

## 2013-05-19 DIAGNOSIS — E119 Type 2 diabetes mellitus without complications: Secondary | ICD-10-CM | POA: Diagnosis not present

## 2013-06-18 DIAGNOSIS — E119 Type 2 diabetes mellitus without complications: Secondary | ICD-10-CM | POA: Diagnosis not present

## 2013-07-17 DIAGNOSIS — E559 Vitamin D deficiency, unspecified: Secondary | ICD-10-CM | POA: Diagnosis not present

## 2013-07-18 DIAGNOSIS — I10 Essential (primary) hypertension: Secondary | ICD-10-CM | POA: Diagnosis not present

## 2013-07-18 DIAGNOSIS — E119 Type 2 diabetes mellitus without complications: Secondary | ICD-10-CM | POA: Diagnosis not present

## 2013-07-18 DIAGNOSIS — Z794 Long term (current) use of insulin: Secondary | ICD-10-CM | POA: Diagnosis not present

## 2013-07-18 DIAGNOSIS — D510 Vitamin B12 deficiency anemia due to intrinsic factor deficiency: Secondary | ICD-10-CM | POA: Diagnosis not present

## 2013-07-18 DIAGNOSIS — I509 Heart failure, unspecified: Secondary | ICD-10-CM | POA: Diagnosis not present

## 2013-08-01 DIAGNOSIS — G309 Alzheimer's disease, unspecified: Secondary | ICD-10-CM | POA: Diagnosis not present

## 2013-08-01 DIAGNOSIS — F0280 Dementia in other diseases classified elsewhere without behavioral disturbance: Secondary | ICD-10-CM | POA: Diagnosis not present

## 2013-08-01 DIAGNOSIS — I10 Essential (primary) hypertension: Secondary | ICD-10-CM | POA: Diagnosis not present

## 2013-08-01 DIAGNOSIS — E119 Type 2 diabetes mellitus without complications: Secondary | ICD-10-CM | POA: Diagnosis not present

## 2013-08-01 DIAGNOSIS — I4891 Unspecified atrial fibrillation: Secondary | ICD-10-CM | POA: Diagnosis not present

## 2013-08-06 DIAGNOSIS — L600 Ingrowing nail: Secondary | ICD-10-CM | POA: Diagnosis not present

## 2013-08-06 DIAGNOSIS — M79609 Pain in unspecified limb: Secondary | ICD-10-CM | POA: Diagnosis not present

## 2013-08-14 DIAGNOSIS — E119 Type 2 diabetes mellitus without complications: Secondary | ICD-10-CM | POA: Diagnosis not present

## 2013-08-14 DIAGNOSIS — I509 Heart failure, unspecified: Secondary | ICD-10-CM | POA: Diagnosis not present

## 2013-08-14 DIAGNOSIS — Z794 Long term (current) use of insulin: Secondary | ICD-10-CM | POA: Diagnosis not present

## 2013-08-14 DIAGNOSIS — D510 Vitamin B12 deficiency anemia due to intrinsic factor deficiency: Secondary | ICD-10-CM | POA: Diagnosis not present

## 2013-08-14 DIAGNOSIS — I10 Essential (primary) hypertension: Secondary | ICD-10-CM | POA: Diagnosis not present

## 2013-09-13 DIAGNOSIS — I509 Heart failure, unspecified: Secondary | ICD-10-CM | POA: Diagnosis not present

## 2013-09-13 DIAGNOSIS — D510 Vitamin B12 deficiency anemia due to intrinsic factor deficiency: Secondary | ICD-10-CM | POA: Diagnosis not present

## 2013-09-13 DIAGNOSIS — I10 Essential (primary) hypertension: Secondary | ICD-10-CM | POA: Diagnosis not present

## 2013-09-13 DIAGNOSIS — E119 Type 2 diabetes mellitus without complications: Secondary | ICD-10-CM | POA: Diagnosis not present

## 2013-09-13 DIAGNOSIS — Z794 Long term (current) use of insulin: Secondary | ICD-10-CM | POA: Diagnosis not present

## 2013-09-16 DIAGNOSIS — Z794 Long term (current) use of insulin: Secondary | ICD-10-CM | POA: Diagnosis not present

## 2013-09-16 DIAGNOSIS — I509 Heart failure, unspecified: Secondary | ICD-10-CM | POA: Diagnosis not present

## 2013-09-16 DIAGNOSIS — E119 Type 2 diabetes mellitus without complications: Secondary | ICD-10-CM | POA: Diagnosis not present

## 2013-09-16 DIAGNOSIS — D510 Vitamin B12 deficiency anemia due to intrinsic factor deficiency: Secondary | ICD-10-CM | POA: Diagnosis not present

## 2013-09-16 DIAGNOSIS — I10 Essential (primary) hypertension: Secondary | ICD-10-CM | POA: Diagnosis not present

## 2013-10-08 DIAGNOSIS — Z23 Encounter for immunization: Secondary | ICD-10-CM | POA: Diagnosis not present

## 2013-10-14 DIAGNOSIS — E119 Type 2 diabetes mellitus without complications: Secondary | ICD-10-CM | POA: Diagnosis not present

## 2013-10-14 DIAGNOSIS — I509 Heart failure, unspecified: Secondary | ICD-10-CM | POA: Diagnosis not present

## 2013-10-14 DIAGNOSIS — I10 Essential (primary) hypertension: Secondary | ICD-10-CM | POA: Diagnosis not present

## 2013-10-14 DIAGNOSIS — Z794 Long term (current) use of insulin: Secondary | ICD-10-CM | POA: Diagnosis not present

## 2013-10-14 DIAGNOSIS — D510 Vitamin B12 deficiency anemia due to intrinsic factor deficiency: Secondary | ICD-10-CM | POA: Diagnosis not present

## 2013-10-24 DIAGNOSIS — E119 Type 2 diabetes mellitus without complications: Secondary | ICD-10-CM | POA: Diagnosis not present

## 2013-10-24 DIAGNOSIS — I482 Chronic atrial fibrillation: Secondary | ICD-10-CM | POA: Diagnosis not present

## 2013-10-24 DIAGNOSIS — E519 Thiamine deficiency, unspecified: Secondary | ICD-10-CM | POA: Diagnosis not present

## 2013-10-24 DIAGNOSIS — I10 Essential (primary) hypertension: Secondary | ICD-10-CM | POA: Diagnosis not present

## 2013-10-28 DIAGNOSIS — Z794 Long term (current) use of insulin: Secondary | ICD-10-CM | POA: Diagnosis not present

## 2013-10-28 DIAGNOSIS — I509 Heart failure, unspecified: Secondary | ICD-10-CM | POA: Diagnosis not present

## 2013-10-28 DIAGNOSIS — D510 Vitamin B12 deficiency anemia due to intrinsic factor deficiency: Secondary | ICD-10-CM | POA: Diagnosis not present

## 2013-10-28 DIAGNOSIS — I10 Essential (primary) hypertension: Secondary | ICD-10-CM | POA: Diagnosis not present

## 2013-10-28 DIAGNOSIS — D649 Anemia, unspecified: Secondary | ICD-10-CM | POA: Diagnosis not present

## 2013-10-28 DIAGNOSIS — E119 Type 2 diabetes mellitus without complications: Secondary | ICD-10-CM | POA: Diagnosis not present

## 2013-10-29 DIAGNOSIS — L600 Ingrowing nail: Secondary | ICD-10-CM | POA: Diagnosis not present

## 2013-10-29 DIAGNOSIS — M79671 Pain in right foot: Secondary | ICD-10-CM | POA: Diagnosis not present

## 2013-11-12 DIAGNOSIS — Z794 Long term (current) use of insulin: Secondary | ICD-10-CM | POA: Diagnosis not present

## 2013-11-12 DIAGNOSIS — D510 Vitamin B12 deficiency anemia due to intrinsic factor deficiency: Secondary | ICD-10-CM | POA: Diagnosis not present

## 2013-11-12 DIAGNOSIS — E119 Type 2 diabetes mellitus without complications: Secondary | ICD-10-CM | POA: Diagnosis not present

## 2013-11-12 DIAGNOSIS — I509 Heart failure, unspecified: Secondary | ICD-10-CM | POA: Diagnosis not present

## 2013-11-12 DIAGNOSIS — I10 Essential (primary) hypertension: Secondary | ICD-10-CM | POA: Diagnosis not present

## 2013-11-15 DIAGNOSIS — I509 Heart failure, unspecified: Secondary | ICD-10-CM | POA: Diagnosis not present

## 2013-11-15 DIAGNOSIS — Z794 Long term (current) use of insulin: Secondary | ICD-10-CM | POA: Diagnosis not present

## 2013-11-15 DIAGNOSIS — I10 Essential (primary) hypertension: Secondary | ICD-10-CM | POA: Diagnosis not present

## 2013-11-15 DIAGNOSIS — E119 Type 2 diabetes mellitus without complications: Secondary | ICD-10-CM | POA: Diagnosis not present

## 2013-11-15 DIAGNOSIS — D510 Vitamin B12 deficiency anemia due to intrinsic factor deficiency: Secondary | ICD-10-CM | POA: Diagnosis not present

## 2013-12-13 DIAGNOSIS — E119 Type 2 diabetes mellitus without complications: Secondary | ICD-10-CM | POA: Diagnosis not present

## 2013-12-13 DIAGNOSIS — I10 Essential (primary) hypertension: Secondary | ICD-10-CM | POA: Diagnosis not present

## 2013-12-13 DIAGNOSIS — I509 Heart failure, unspecified: Secondary | ICD-10-CM | POA: Diagnosis not present

## 2013-12-13 DIAGNOSIS — D510 Vitamin B12 deficiency anemia due to intrinsic factor deficiency: Secondary | ICD-10-CM | POA: Diagnosis not present

## 2013-12-13 DIAGNOSIS — Z794 Long term (current) use of insulin: Secondary | ICD-10-CM | POA: Diagnosis not present

## 2014-01-13 DIAGNOSIS — Z794 Long term (current) use of insulin: Secondary | ICD-10-CM | POA: Diagnosis not present

## 2014-01-13 DIAGNOSIS — I509 Heart failure, unspecified: Secondary | ICD-10-CM | POA: Diagnosis not present

## 2014-01-13 DIAGNOSIS — E119 Type 2 diabetes mellitus without complications: Secondary | ICD-10-CM | POA: Diagnosis not present

## 2014-01-13 DIAGNOSIS — I10 Essential (primary) hypertension: Secondary | ICD-10-CM | POA: Diagnosis not present

## 2014-01-13 DIAGNOSIS — D510 Vitamin B12 deficiency anemia due to intrinsic factor deficiency: Secondary | ICD-10-CM | POA: Diagnosis not present

## 2014-01-14 DIAGNOSIS — E119 Type 2 diabetes mellitus without complications: Secondary | ICD-10-CM | POA: Diagnosis not present

## 2014-01-14 DIAGNOSIS — I509 Heart failure, unspecified: Secondary | ICD-10-CM | POA: Diagnosis not present

## 2014-01-14 DIAGNOSIS — Z794 Long term (current) use of insulin: Secondary | ICD-10-CM | POA: Diagnosis not present

## 2014-01-14 DIAGNOSIS — D510 Vitamin B12 deficiency anemia due to intrinsic factor deficiency: Secondary | ICD-10-CM | POA: Diagnosis not present

## 2014-01-14 DIAGNOSIS — I10 Essential (primary) hypertension: Secondary | ICD-10-CM | POA: Diagnosis not present

## 2014-01-16 DIAGNOSIS — E538 Deficiency of other specified B group vitamins: Secondary | ICD-10-CM | POA: Diagnosis not present

## 2014-01-16 DIAGNOSIS — I10 Essential (primary) hypertension: Secondary | ICD-10-CM | POA: Diagnosis not present

## 2014-01-16 DIAGNOSIS — E119 Type 2 diabetes mellitus without complications: Secondary | ICD-10-CM | POA: Diagnosis not present

## 2014-01-16 DIAGNOSIS — I482 Chronic atrial fibrillation: Secondary | ICD-10-CM | POA: Diagnosis not present

## 2014-01-22 DIAGNOSIS — D510 Vitamin B12 deficiency anemia due to intrinsic factor deficiency: Secondary | ICD-10-CM | POA: Diagnosis not present

## 2014-01-22 DIAGNOSIS — I509 Heart failure, unspecified: Secondary | ICD-10-CM | POA: Diagnosis not present

## 2014-01-22 DIAGNOSIS — Z794 Long term (current) use of insulin: Secondary | ICD-10-CM | POA: Diagnosis not present

## 2014-01-22 DIAGNOSIS — E119 Type 2 diabetes mellitus without complications: Secondary | ICD-10-CM | POA: Diagnosis not present

## 2014-01-22 DIAGNOSIS — I10 Essential (primary) hypertension: Secondary | ICD-10-CM | POA: Diagnosis not present

## 2014-01-22 DIAGNOSIS — E109 Type 1 diabetes mellitus without complications: Secondary | ICD-10-CM | POA: Diagnosis not present

## 2014-01-30 DIAGNOSIS — R54 Age-related physical debility: Secondary | ICD-10-CM | POA: Diagnosis not present

## 2014-01-30 DIAGNOSIS — F0390 Unspecified dementia without behavioral disturbance: Secondary | ICD-10-CM | POA: Diagnosis not present

## 2014-02-04 DIAGNOSIS — M79671 Pain in right foot: Secondary | ICD-10-CM | POA: Diagnosis not present

## 2014-02-04 DIAGNOSIS — L600 Ingrowing nail: Secondary | ICD-10-CM | POA: Diagnosis not present

## 2014-02-04 DIAGNOSIS — L609 Nail disorder, unspecified: Secondary | ICD-10-CM | POA: Diagnosis not present

## 2014-02-18 DIAGNOSIS — I10 Essential (primary) hypertension: Secondary | ICD-10-CM | POA: Diagnosis not present

## 2014-02-18 DIAGNOSIS — I509 Heart failure, unspecified: Secondary | ICD-10-CM | POA: Diagnosis not present

## 2014-02-18 DIAGNOSIS — Z794 Long term (current) use of insulin: Secondary | ICD-10-CM | POA: Diagnosis not present

## 2014-02-18 DIAGNOSIS — D510 Vitamin B12 deficiency anemia due to intrinsic factor deficiency: Secondary | ICD-10-CM | POA: Diagnosis not present

## 2014-02-18 DIAGNOSIS — E119 Type 2 diabetes mellitus without complications: Secondary | ICD-10-CM | POA: Diagnosis not present

## 2014-02-27 DIAGNOSIS — R2689 Other abnormalities of gait and mobility: Secondary | ICD-10-CM | POA: Diagnosis not present

## 2014-03-03 DIAGNOSIS — I10 Essential (primary) hypertension: Secondary | ICD-10-CM | POA: Diagnosis not present

## 2014-03-03 DIAGNOSIS — D510 Vitamin B12 deficiency anemia due to intrinsic factor deficiency: Secondary | ICD-10-CM | POA: Diagnosis not present

## 2014-03-03 DIAGNOSIS — E119 Type 2 diabetes mellitus without complications: Secondary | ICD-10-CM | POA: Diagnosis not present

## 2014-03-03 DIAGNOSIS — Z794 Long term (current) use of insulin: Secondary | ICD-10-CM | POA: Diagnosis not present

## 2014-03-03 DIAGNOSIS — I509 Heart failure, unspecified: Secondary | ICD-10-CM | POA: Diagnosis not present

## 2014-03-04 DIAGNOSIS — E119 Type 2 diabetes mellitus without complications: Secondary | ICD-10-CM | POA: Diagnosis not present

## 2014-03-04 DIAGNOSIS — D510 Vitamin B12 deficiency anemia due to intrinsic factor deficiency: Secondary | ICD-10-CM | POA: Diagnosis not present

## 2014-03-06 DIAGNOSIS — I509 Heart failure, unspecified: Secondary | ICD-10-CM | POA: Diagnosis not present

## 2014-03-06 DIAGNOSIS — E119 Type 2 diabetes mellitus without complications: Secondary | ICD-10-CM | POA: Diagnosis not present

## 2014-03-06 DIAGNOSIS — Z794 Long term (current) use of insulin: Secondary | ICD-10-CM | POA: Diagnosis not present

## 2014-03-06 DIAGNOSIS — I10 Essential (primary) hypertension: Secondary | ICD-10-CM | POA: Diagnosis not present

## 2014-03-06 DIAGNOSIS — D510 Vitamin B12 deficiency anemia due to intrinsic factor deficiency: Secondary | ICD-10-CM | POA: Diagnosis not present

## 2014-03-10 DIAGNOSIS — E119 Type 2 diabetes mellitus without complications: Secondary | ICD-10-CM | POA: Diagnosis not present

## 2014-03-10 DIAGNOSIS — D510 Vitamin B12 deficiency anemia due to intrinsic factor deficiency: Secondary | ICD-10-CM | POA: Diagnosis not present

## 2014-03-10 DIAGNOSIS — Z794 Long term (current) use of insulin: Secondary | ICD-10-CM | POA: Diagnosis not present

## 2014-03-10 DIAGNOSIS — I509 Heart failure, unspecified: Secondary | ICD-10-CM | POA: Diagnosis not present

## 2014-03-10 DIAGNOSIS — I10 Essential (primary) hypertension: Secondary | ICD-10-CM | POA: Diagnosis not present

## 2014-03-13 DIAGNOSIS — Z794 Long term (current) use of insulin: Secondary | ICD-10-CM | POA: Diagnosis not present

## 2014-03-13 DIAGNOSIS — I509 Heart failure, unspecified: Secondary | ICD-10-CM | POA: Diagnosis not present

## 2014-03-13 DIAGNOSIS — I10 Essential (primary) hypertension: Secondary | ICD-10-CM | POA: Diagnosis not present

## 2014-03-13 DIAGNOSIS — E119 Type 2 diabetes mellitus without complications: Secondary | ICD-10-CM | POA: Diagnosis not present

## 2014-03-13 DIAGNOSIS — D510 Vitamin B12 deficiency anemia due to intrinsic factor deficiency: Secondary | ICD-10-CM | POA: Diagnosis not present

## 2014-03-15 DIAGNOSIS — D510 Vitamin B12 deficiency anemia due to intrinsic factor deficiency: Secondary | ICD-10-CM | POA: Diagnosis not present

## 2014-03-15 DIAGNOSIS — Z794 Long term (current) use of insulin: Secondary | ICD-10-CM | POA: Diagnosis not present

## 2014-03-15 DIAGNOSIS — I509 Heart failure, unspecified: Secondary | ICD-10-CM | POA: Diagnosis not present

## 2014-03-15 DIAGNOSIS — I10 Essential (primary) hypertension: Secondary | ICD-10-CM | POA: Diagnosis not present

## 2014-03-15 DIAGNOSIS — E119 Type 2 diabetes mellitus without complications: Secondary | ICD-10-CM | POA: Diagnosis not present

## 2014-04-10 DIAGNOSIS — I10 Essential (primary) hypertension: Secondary | ICD-10-CM | POA: Diagnosis not present

## 2014-04-10 DIAGNOSIS — E538 Deficiency of other specified B group vitamins: Secondary | ICD-10-CM | POA: Diagnosis not present

## 2014-04-10 DIAGNOSIS — E119 Type 2 diabetes mellitus without complications: Secondary | ICD-10-CM | POA: Diagnosis not present

## 2014-04-10 DIAGNOSIS — I482 Chronic atrial fibrillation: Secondary | ICD-10-CM | POA: Diagnosis not present

## 2014-04-16 DIAGNOSIS — I10 Essential (primary) hypertension: Secondary | ICD-10-CM | POA: Diagnosis not present

## 2014-04-16 DIAGNOSIS — I509 Heart failure, unspecified: Secondary | ICD-10-CM | POA: Diagnosis not present

## 2014-04-16 DIAGNOSIS — D510 Vitamin B12 deficiency anemia due to intrinsic factor deficiency: Secondary | ICD-10-CM | POA: Diagnosis not present

## 2014-04-16 DIAGNOSIS — E119 Type 2 diabetes mellitus without complications: Secondary | ICD-10-CM | POA: Diagnosis not present

## 2014-04-16 DIAGNOSIS — Z794 Long term (current) use of insulin: Secondary | ICD-10-CM | POA: Diagnosis not present

## 2014-04-17 DIAGNOSIS — S8012XA Contusion of left lower leg, initial encounter: Secondary | ICD-10-CM | POA: Diagnosis not present

## 2014-04-17 DIAGNOSIS — R54 Age-related physical debility: Secondary | ICD-10-CM | POA: Diagnosis not present

## 2014-04-17 DIAGNOSIS — W19XXXA Unspecified fall, initial encounter: Secondary | ICD-10-CM | POA: Diagnosis not present

## 2014-04-18 DIAGNOSIS — I10 Essential (primary) hypertension: Secondary | ICD-10-CM | POA: Diagnosis not present

## 2014-04-18 DIAGNOSIS — R54 Age-related physical debility: Secondary | ICD-10-CM | POA: Diagnosis not present

## 2014-04-18 DIAGNOSIS — I482 Chronic atrial fibrillation: Secondary | ICD-10-CM | POA: Diagnosis not present

## 2014-04-18 DIAGNOSIS — E119 Type 2 diabetes mellitus without complications: Secondary | ICD-10-CM | POA: Diagnosis not present

## 2014-04-18 DIAGNOSIS — E538 Deficiency of other specified B group vitamins: Secondary | ICD-10-CM | POA: Diagnosis not present

## 2014-05-06 DIAGNOSIS — M79671 Pain in right foot: Secondary | ICD-10-CM | POA: Diagnosis not present

## 2014-05-06 DIAGNOSIS — L609 Nail disorder, unspecified: Secondary | ICD-10-CM | POA: Diagnosis not present

## 2014-05-06 DIAGNOSIS — L600 Ingrowing nail: Secondary | ICD-10-CM | POA: Diagnosis not present

## 2014-05-12 DIAGNOSIS — D510 Vitamin B12 deficiency anemia due to intrinsic factor deficiency: Secondary | ICD-10-CM | POA: Diagnosis not present

## 2014-05-12 DIAGNOSIS — I509 Heart failure, unspecified: Secondary | ICD-10-CM | POA: Diagnosis not present

## 2014-05-12 DIAGNOSIS — E119 Type 2 diabetes mellitus without complications: Secondary | ICD-10-CM | POA: Diagnosis not present

## 2014-05-12 DIAGNOSIS — Z794 Long term (current) use of insulin: Secondary | ICD-10-CM | POA: Diagnosis not present

## 2014-05-12 DIAGNOSIS — I10 Essential (primary) hypertension: Secondary | ICD-10-CM | POA: Diagnosis not present

## 2014-05-14 DIAGNOSIS — Z794 Long term (current) use of insulin: Secondary | ICD-10-CM | POA: Diagnosis not present

## 2014-05-14 DIAGNOSIS — E119 Type 2 diabetes mellitus without complications: Secondary | ICD-10-CM | POA: Diagnosis not present

## 2014-05-14 DIAGNOSIS — D510 Vitamin B12 deficiency anemia due to intrinsic factor deficiency: Secondary | ICD-10-CM | POA: Diagnosis not present

## 2014-05-14 DIAGNOSIS — I10 Essential (primary) hypertension: Secondary | ICD-10-CM | POA: Diagnosis not present

## 2014-05-14 DIAGNOSIS — I509 Heart failure, unspecified: Secondary | ICD-10-CM | POA: Diagnosis not present

## 2014-06-03 DIAGNOSIS — E119 Type 2 diabetes mellitus without complications: Secondary | ICD-10-CM | POA: Diagnosis not present

## 2014-06-03 DIAGNOSIS — I10 Essential (primary) hypertension: Secondary | ICD-10-CM | POA: Diagnosis not present

## 2014-06-03 DIAGNOSIS — I509 Heart failure, unspecified: Secondary | ICD-10-CM | POA: Diagnosis not present

## 2014-06-03 DIAGNOSIS — D510 Vitamin B12 deficiency anemia due to intrinsic factor deficiency: Secondary | ICD-10-CM | POA: Diagnosis not present

## 2014-06-03 DIAGNOSIS — Z794 Long term (current) use of insulin: Secondary | ICD-10-CM | POA: Diagnosis not present

## 2014-06-09 DIAGNOSIS — E119 Type 2 diabetes mellitus without complications: Secondary | ICD-10-CM | POA: Diagnosis not present

## 2014-06-09 DIAGNOSIS — Z794 Long term (current) use of insulin: Secondary | ICD-10-CM | POA: Diagnosis not present

## 2014-06-09 DIAGNOSIS — D510 Vitamin B12 deficiency anemia due to intrinsic factor deficiency: Secondary | ICD-10-CM | POA: Diagnosis not present

## 2014-06-09 DIAGNOSIS — I509 Heart failure, unspecified: Secondary | ICD-10-CM | POA: Diagnosis not present

## 2014-06-09 DIAGNOSIS — I10 Essential (primary) hypertension: Secondary | ICD-10-CM | POA: Diagnosis not present

## 2014-07-09 DIAGNOSIS — I509 Heart failure, unspecified: Secondary | ICD-10-CM | POA: Diagnosis not present

## 2014-07-09 DIAGNOSIS — D510 Vitamin B12 deficiency anemia due to intrinsic factor deficiency: Secondary | ICD-10-CM | POA: Diagnosis not present

## 2014-07-09 DIAGNOSIS — Z794 Long term (current) use of insulin: Secondary | ICD-10-CM | POA: Diagnosis not present

## 2014-07-09 DIAGNOSIS — E119 Type 2 diabetes mellitus without complications: Secondary | ICD-10-CM | POA: Diagnosis not present

## 2014-07-09 DIAGNOSIS — I10 Essential (primary) hypertension: Secondary | ICD-10-CM | POA: Diagnosis not present

## 2014-07-10 DIAGNOSIS — I482 Chronic atrial fibrillation: Secondary | ICD-10-CM | POA: Diagnosis not present

## 2014-07-10 DIAGNOSIS — E559 Vitamin D deficiency, unspecified: Secondary | ICD-10-CM | POA: Diagnosis not present

## 2014-07-10 DIAGNOSIS — I509 Heart failure, unspecified: Secondary | ICD-10-CM | POA: Diagnosis not present

## 2014-07-10 DIAGNOSIS — I10 Essential (primary) hypertension: Secondary | ICD-10-CM | POA: Diagnosis not present

## 2014-07-10 DIAGNOSIS — E119 Type 2 diabetes mellitus without complications: Secondary | ICD-10-CM | POA: Diagnosis not present

## 2014-07-10 DIAGNOSIS — F0390 Unspecified dementia without behavioral disturbance: Secondary | ICD-10-CM | POA: Diagnosis not present

## 2014-07-10 DIAGNOSIS — D519 Vitamin B12 deficiency anemia, unspecified: Secondary | ICD-10-CM | POA: Diagnosis not present

## 2014-07-13 DIAGNOSIS — E119 Type 2 diabetes mellitus without complications: Secondary | ICD-10-CM | POA: Diagnosis not present

## 2014-07-13 DIAGNOSIS — Z794 Long term (current) use of insulin: Secondary | ICD-10-CM | POA: Diagnosis not present

## 2014-07-13 DIAGNOSIS — F0390 Unspecified dementia without behavioral disturbance: Secondary | ICD-10-CM | POA: Diagnosis not present

## 2014-07-13 DIAGNOSIS — I10 Essential (primary) hypertension: Secondary | ICD-10-CM | POA: Diagnosis not present

## 2014-07-13 DIAGNOSIS — I509 Heart failure, unspecified: Secondary | ICD-10-CM | POA: Diagnosis not present

## 2014-07-13 DIAGNOSIS — D510 Vitamin B12 deficiency anemia due to intrinsic factor deficiency: Secondary | ICD-10-CM | POA: Diagnosis not present

## 2014-08-05 DIAGNOSIS — L609 Nail disorder, unspecified: Secondary | ICD-10-CM | POA: Diagnosis not present

## 2014-08-05 DIAGNOSIS — M79671 Pain in right foot: Secondary | ICD-10-CM | POA: Diagnosis not present

## 2014-08-05 DIAGNOSIS — L600 Ingrowing nail: Secondary | ICD-10-CM | POA: Diagnosis not present

## 2014-08-12 DIAGNOSIS — F0390 Unspecified dementia without behavioral disturbance: Secondary | ICD-10-CM | POA: Diagnosis not present

## 2014-08-12 DIAGNOSIS — Z794 Long term (current) use of insulin: Secondary | ICD-10-CM | POA: Diagnosis not present

## 2014-08-12 DIAGNOSIS — I10 Essential (primary) hypertension: Secondary | ICD-10-CM | POA: Diagnosis not present

## 2014-08-12 DIAGNOSIS — I509 Heart failure, unspecified: Secondary | ICD-10-CM | POA: Diagnosis not present

## 2014-08-12 DIAGNOSIS — D510 Vitamin B12 deficiency anemia due to intrinsic factor deficiency: Secondary | ICD-10-CM | POA: Diagnosis not present

## 2014-08-12 DIAGNOSIS — E119 Type 2 diabetes mellitus without complications: Secondary | ICD-10-CM | POA: Diagnosis not present

## 2014-09-09 DIAGNOSIS — I509 Heart failure, unspecified: Secondary | ICD-10-CM | POA: Diagnosis not present

## 2014-09-09 DIAGNOSIS — E119 Type 2 diabetes mellitus without complications: Secondary | ICD-10-CM | POA: Diagnosis not present

## 2014-09-09 DIAGNOSIS — Z794 Long term (current) use of insulin: Secondary | ICD-10-CM | POA: Diagnosis not present

## 2014-09-09 DIAGNOSIS — F0390 Unspecified dementia without behavioral disturbance: Secondary | ICD-10-CM | POA: Diagnosis not present

## 2014-09-09 DIAGNOSIS — I10 Essential (primary) hypertension: Secondary | ICD-10-CM | POA: Diagnosis not present

## 2014-09-09 DIAGNOSIS — D510 Vitamin B12 deficiency anemia due to intrinsic factor deficiency: Secondary | ICD-10-CM | POA: Diagnosis not present

## 2014-09-11 DIAGNOSIS — F0390 Unspecified dementia without behavioral disturbance: Secondary | ICD-10-CM | POA: Diagnosis not present

## 2014-09-11 DIAGNOSIS — D510 Vitamin B12 deficiency anemia due to intrinsic factor deficiency: Secondary | ICD-10-CM | POA: Diagnosis not present

## 2014-09-11 DIAGNOSIS — I10 Essential (primary) hypertension: Secondary | ICD-10-CM | POA: Diagnosis not present

## 2014-09-11 DIAGNOSIS — E119 Type 2 diabetes mellitus without complications: Secondary | ICD-10-CM | POA: Diagnosis not present

## 2014-09-11 DIAGNOSIS — I509 Heart failure, unspecified: Secondary | ICD-10-CM | POA: Diagnosis not present

## 2014-09-11 DIAGNOSIS — Z794 Long term (current) use of insulin: Secondary | ICD-10-CM | POA: Diagnosis not present

## 2014-09-25 DIAGNOSIS — Z23 Encounter for immunization: Secondary | ICD-10-CM | POA: Diagnosis not present

## 2014-10-09 DIAGNOSIS — E559 Vitamin D deficiency, unspecified: Secondary | ICD-10-CM | POA: Diagnosis not present

## 2014-10-09 DIAGNOSIS — F0390 Unspecified dementia without behavioral disturbance: Secondary | ICD-10-CM | POA: Diagnosis not present

## 2014-10-09 DIAGNOSIS — I482 Chronic atrial fibrillation: Secondary | ICD-10-CM | POA: Diagnosis not present

## 2014-10-09 DIAGNOSIS — E119 Type 2 diabetes mellitus without complications: Secondary | ICD-10-CM | POA: Diagnosis not present

## 2014-10-09 DIAGNOSIS — I10 Essential (primary) hypertension: Secondary | ICD-10-CM | POA: Diagnosis not present

## 2014-10-09 DIAGNOSIS — I509 Heart failure, unspecified: Secondary | ICD-10-CM | POA: Diagnosis not present

## 2014-10-09 DIAGNOSIS — D519 Vitamin B12 deficiency anemia, unspecified: Secondary | ICD-10-CM | POA: Diagnosis not present

## 2014-10-09 DIAGNOSIS — F330 Major depressive disorder, recurrent, mild: Secondary | ICD-10-CM | POA: Diagnosis not present

## 2014-10-14 DIAGNOSIS — E119 Type 2 diabetes mellitus without complications: Secondary | ICD-10-CM | POA: Diagnosis not present

## 2014-10-14 DIAGNOSIS — Z794 Long term (current) use of insulin: Secondary | ICD-10-CM | POA: Diagnosis not present

## 2014-10-14 DIAGNOSIS — I10 Essential (primary) hypertension: Secondary | ICD-10-CM | POA: Diagnosis not present

## 2014-10-14 DIAGNOSIS — E109 Type 1 diabetes mellitus without complications: Secondary | ICD-10-CM | POA: Diagnosis not present

## 2014-10-14 DIAGNOSIS — I509 Heart failure, unspecified: Secondary | ICD-10-CM | POA: Diagnosis not present

## 2014-10-14 DIAGNOSIS — Z5181 Encounter for therapeutic drug level monitoring: Secondary | ICD-10-CM | POA: Diagnosis not present

## 2014-10-14 DIAGNOSIS — F0390 Unspecified dementia without behavioral disturbance: Secondary | ICD-10-CM | POA: Diagnosis not present

## 2014-10-14 DIAGNOSIS — D510 Vitamin B12 deficiency anemia due to intrinsic factor deficiency: Secondary | ICD-10-CM | POA: Diagnosis not present

## 2014-10-16 DIAGNOSIS — E559 Vitamin D deficiency, unspecified: Secondary | ICD-10-CM | POA: Diagnosis not present

## 2014-11-04 DIAGNOSIS — L609 Nail disorder, unspecified: Secondary | ICD-10-CM | POA: Diagnosis not present

## 2014-11-04 DIAGNOSIS — L600 Ingrowing nail: Secondary | ICD-10-CM | POA: Diagnosis not present

## 2014-11-04 DIAGNOSIS — M79671 Pain in right foot: Secondary | ICD-10-CM | POA: Diagnosis not present

## 2014-11-05 DIAGNOSIS — I509 Heart failure, unspecified: Secondary | ICD-10-CM | POA: Diagnosis not present

## 2014-11-05 DIAGNOSIS — D510 Vitamin B12 deficiency anemia due to intrinsic factor deficiency: Secondary | ICD-10-CM | POA: Diagnosis not present

## 2014-11-05 DIAGNOSIS — F0390 Unspecified dementia without behavioral disturbance: Secondary | ICD-10-CM | POA: Diagnosis not present

## 2014-11-05 DIAGNOSIS — I10 Essential (primary) hypertension: Secondary | ICD-10-CM | POA: Diagnosis not present

## 2014-11-05 DIAGNOSIS — Z794 Long term (current) use of insulin: Secondary | ICD-10-CM | POA: Diagnosis not present

## 2014-11-05 DIAGNOSIS — E119 Type 2 diabetes mellitus without complications: Secondary | ICD-10-CM | POA: Diagnosis not present

## 2014-11-10 DIAGNOSIS — D510 Vitamin B12 deficiency anemia due to intrinsic factor deficiency: Secondary | ICD-10-CM | POA: Diagnosis not present

## 2014-11-10 DIAGNOSIS — I509 Heart failure, unspecified: Secondary | ICD-10-CM | POA: Diagnosis not present

## 2014-11-10 DIAGNOSIS — Z794 Long term (current) use of insulin: Secondary | ICD-10-CM | POA: Diagnosis not present

## 2014-11-10 DIAGNOSIS — I10 Essential (primary) hypertension: Secondary | ICD-10-CM | POA: Diagnosis not present

## 2014-11-10 DIAGNOSIS — E119 Type 2 diabetes mellitus without complications: Secondary | ICD-10-CM | POA: Diagnosis not present

## 2014-11-10 DIAGNOSIS — F0390 Unspecified dementia without behavioral disturbance: Secondary | ICD-10-CM | POA: Diagnosis not present

## 2014-11-25 DIAGNOSIS — I509 Heart failure, unspecified: Secondary | ICD-10-CM | POA: Diagnosis not present

## 2014-11-25 DIAGNOSIS — F0390 Unspecified dementia without behavioral disturbance: Secondary | ICD-10-CM | POA: Diagnosis not present

## 2014-11-25 DIAGNOSIS — I10 Essential (primary) hypertension: Secondary | ICD-10-CM | POA: Diagnosis not present

## 2014-11-25 DIAGNOSIS — D510 Vitamin B12 deficiency anemia due to intrinsic factor deficiency: Secondary | ICD-10-CM | POA: Diagnosis not present

## 2014-11-25 DIAGNOSIS — Z794 Long term (current) use of insulin: Secondary | ICD-10-CM | POA: Diagnosis not present

## 2014-11-25 DIAGNOSIS — E119 Type 2 diabetes mellitus without complications: Secondary | ICD-10-CM | POA: Diagnosis not present

## 2014-12-23 DIAGNOSIS — R300 Dysuria: Secondary | ICD-10-CM | POA: Diagnosis not present

## 2015-01-01 DIAGNOSIS — I482 Chronic atrial fibrillation: Secondary | ICD-10-CM | POA: Diagnosis not present

## 2015-01-01 DIAGNOSIS — F0390 Unspecified dementia without behavioral disturbance: Secondary | ICD-10-CM | POA: Diagnosis not present

## 2015-01-01 DIAGNOSIS — I10 Essential (primary) hypertension: Secondary | ICD-10-CM | POA: Diagnosis not present

## 2015-01-01 DIAGNOSIS — E119 Type 2 diabetes mellitus without complications: Secondary | ICD-10-CM | POA: Diagnosis not present

## 2015-01-29 DIAGNOSIS — R451 Restlessness and agitation: Secondary | ICD-10-CM | POA: Diagnosis not present

## 2015-01-29 DIAGNOSIS — F0390 Unspecified dementia without behavioral disturbance: Secondary | ICD-10-CM | POA: Diagnosis not present

## 2015-01-29 DIAGNOSIS — R54 Age-related physical debility: Secondary | ICD-10-CM | POA: Diagnosis not present

## 2015-02-07 DIAGNOSIS — Z8673 Personal history of transient ischemic attack (TIA), and cerebral infarction without residual deficits: Secondary | ICD-10-CM | POA: Diagnosis not present

## 2015-02-07 DIAGNOSIS — I10 Essential (primary) hypertension: Secondary | ICD-10-CM | POA: Diagnosis not present

## 2015-02-07 DIAGNOSIS — Z9049 Acquired absence of other specified parts of digestive tract: Secondary | ICD-10-CM | POA: Diagnosis not present

## 2015-02-07 DIAGNOSIS — Z794 Long term (current) use of insulin: Secondary | ICD-10-CM | POA: Diagnosis not present

## 2015-02-07 DIAGNOSIS — N189 Chronic kidney disease, unspecified: Secondary | ICD-10-CM | POA: Diagnosis present

## 2015-02-07 DIAGNOSIS — R072 Precordial pain: Secondary | ICD-10-CM | POA: Diagnosis not present

## 2015-02-07 DIAGNOSIS — I509 Heart failure, unspecified: Secondary | ICD-10-CM | POA: Diagnosis not present

## 2015-02-07 DIAGNOSIS — Z6835 Body mass index (BMI) 35.0-35.9, adult: Secondary | ICD-10-CM | POA: Diagnosis not present

## 2015-02-07 DIAGNOSIS — E119 Type 2 diabetes mellitus without complications: Secondary | ICD-10-CM | POA: Diagnosis not present

## 2015-02-07 DIAGNOSIS — R32 Unspecified urinary incontinence: Secondary | ICD-10-CM | POA: Diagnosis present

## 2015-02-07 DIAGNOSIS — G20 Parkinson's disease: Secondary | ICD-10-CM | POA: Diagnosis not present

## 2015-02-07 DIAGNOSIS — E669 Obesity, unspecified: Secondary | ICD-10-CM | POA: Diagnosis present

## 2015-02-07 DIAGNOSIS — Z7982 Long term (current) use of aspirin: Secondary | ICD-10-CM | POA: Diagnosis not present

## 2015-02-07 DIAGNOSIS — I340 Nonrheumatic mitral (valve) insufficiency: Secondary | ICD-10-CM | POA: Diagnosis not present

## 2015-02-07 DIAGNOSIS — F0281 Dementia in other diseases classified elsewhere with behavioral disturbance: Secondary | ICD-10-CM | POA: Diagnosis not present

## 2015-02-07 DIAGNOSIS — Z87442 Personal history of urinary calculi: Secondary | ICD-10-CM | POA: Diagnosis not present

## 2015-02-07 DIAGNOSIS — Z7984 Long term (current) use of oral hypoglycemic drugs: Secondary | ICD-10-CM | POA: Diagnosis not present

## 2015-02-07 DIAGNOSIS — R0789 Other chest pain: Secondary | ICD-10-CM | POA: Diagnosis not present

## 2015-02-07 DIAGNOSIS — R159 Full incontinence of feces: Secondary | ICD-10-CM | POA: Diagnosis present

## 2015-02-07 DIAGNOSIS — Z66 Do not resuscitate: Secondary | ICD-10-CM | POA: Diagnosis present

## 2015-02-07 DIAGNOSIS — G301 Alzheimer's disease with late onset: Secondary | ICD-10-CM | POA: Diagnosis not present

## 2015-02-07 DIAGNOSIS — I214 Non-ST elevation (NSTEMI) myocardial infarction: Secondary | ICD-10-CM | POA: Diagnosis not present

## 2015-02-07 DIAGNOSIS — R079 Chest pain, unspecified: Secondary | ICD-10-CM | POA: Diagnosis not present

## 2015-02-07 DIAGNOSIS — I130 Hypertensive heart and chronic kidney disease with heart failure and stage 1 through stage 4 chronic kidney disease, or unspecified chronic kidney disease: Secondary | ICD-10-CM | POA: Diagnosis not present

## 2015-02-10 DIAGNOSIS — M79671 Pain in right foot: Secondary | ICD-10-CM | POA: Diagnosis not present

## 2015-02-10 DIAGNOSIS — L609 Nail disorder, unspecified: Secondary | ICD-10-CM | POA: Diagnosis not present

## 2015-02-10 DIAGNOSIS — L600 Ingrowing nail: Secondary | ICD-10-CM | POA: Diagnosis not present

## 2015-02-12 DIAGNOSIS — R54 Age-related physical debility: Secondary | ICD-10-CM | POA: Diagnosis not present

## 2015-02-12 DIAGNOSIS — I213 ST elevation (STEMI) myocardial infarction of unspecified site: Secondary | ICD-10-CM | POA: Diagnosis not present

## 2015-02-12 DIAGNOSIS — F0390 Unspecified dementia without behavioral disturbance: Secondary | ICD-10-CM | POA: Diagnosis not present

## 2015-02-12 DIAGNOSIS — E119 Type 2 diabetes mellitus without complications: Secondary | ICD-10-CM | POA: Diagnosis not present

## 2015-02-12 DIAGNOSIS — I482 Chronic atrial fibrillation: Secondary | ICD-10-CM | POA: Diagnosis not present

## 2015-02-12 DIAGNOSIS — I10 Essential (primary) hypertension: Secondary | ICD-10-CM | POA: Diagnosis not present

## 2015-05-05 DIAGNOSIS — L609 Nail disorder, unspecified: Secondary | ICD-10-CM | POA: Diagnosis not present

## 2015-05-05 DIAGNOSIS — M79671 Pain in right foot: Secondary | ICD-10-CM | POA: Diagnosis not present

## 2015-05-05 DIAGNOSIS — L600 Ingrowing nail: Secondary | ICD-10-CM | POA: Diagnosis not present

## 2015-05-07 DIAGNOSIS — I480 Paroxysmal atrial fibrillation: Secondary | ICD-10-CM | POA: Diagnosis not present

## 2015-05-07 DIAGNOSIS — I509 Heart failure, unspecified: Secondary | ICD-10-CM | POA: Diagnosis not present

## 2015-05-07 DIAGNOSIS — I10 Essential (primary) hypertension: Secondary | ICD-10-CM | POA: Diagnosis not present

## 2015-05-07 DIAGNOSIS — I2510 Atherosclerotic heart disease of native coronary artery without angina pectoris: Secondary | ICD-10-CM | POA: Diagnosis not present

## 2015-05-07 DIAGNOSIS — R600 Localized edema: Secondary | ICD-10-CM | POA: Diagnosis not present

## 2015-05-19 DIAGNOSIS — R262 Difficulty in walking, not elsewhere classified: Secondary | ICD-10-CM | POA: Diagnosis not present

## 2015-05-19 DIAGNOSIS — R490 Dysphonia: Secondary | ICD-10-CM | POA: Diagnosis not present

## 2015-05-19 DIAGNOSIS — R05 Cough: Secondary | ICD-10-CM | POA: Diagnosis not present

## 2015-05-19 DIAGNOSIS — R531 Weakness: Secondary | ICD-10-CM | POA: Diagnosis not present

## 2015-05-20 DIAGNOSIS — E1165 Type 2 diabetes mellitus with hyperglycemia: Secondary | ICD-10-CM | POA: Diagnosis not present

## 2015-05-20 DIAGNOSIS — F0390 Unspecified dementia without behavioral disturbance: Secondary | ICD-10-CM | POA: Diagnosis not present

## 2015-05-20 DIAGNOSIS — I509 Heart failure, unspecified: Secondary | ICD-10-CM | POA: Diagnosis not present

## 2015-05-21 DIAGNOSIS — R4182 Altered mental status, unspecified: Secondary | ICD-10-CM | POA: Diagnosis not present

## 2015-05-21 DIAGNOSIS — F4489 Other dissociative and conversion disorders: Secondary | ICD-10-CM | POA: Diagnosis not present

## 2015-05-21 DIAGNOSIS — R079 Chest pain, unspecified: Secondary | ICD-10-CM | POA: Diagnosis not present

## 2015-05-21 DIAGNOSIS — R630 Anorexia: Secondary | ICD-10-CM | POA: Diagnosis not present

## 2015-05-21 DIAGNOSIS — I10 Essential (primary) hypertension: Secondary | ICD-10-CM | POA: Diagnosis not present

## 2015-05-21 DIAGNOSIS — R32 Unspecified urinary incontinence: Secondary | ICD-10-CM | POA: Diagnosis not present

## 2015-05-21 DIAGNOSIS — D72829 Elevated white blood cell count, unspecified: Secondary | ICD-10-CM | POA: Diagnosis not present

## 2015-05-21 DIAGNOSIS — R404 Transient alteration of awareness: Secondary | ICD-10-CM | POA: Diagnosis not present

## 2015-05-21 DIAGNOSIS — R451 Restlessness and agitation: Secondary | ICD-10-CM | POA: Diagnosis not present

## 2015-05-21 DIAGNOSIS — R531 Weakness: Secondary | ICD-10-CM | POA: Diagnosis not present

## 2015-05-21 DIAGNOSIS — R54 Age-related physical debility: Secondary | ICD-10-CM | POA: Diagnosis not present

## 2015-05-21 DIAGNOSIS — N390 Urinary tract infection, site not specified: Secondary | ICD-10-CM | POA: Diagnosis not present

## 2015-05-21 DIAGNOSIS — R5381 Other malaise: Secondary | ICD-10-CM | POA: Diagnosis not present

## 2015-05-23 DIAGNOSIS — K760 Fatty (change of) liver, not elsewhere classified: Secondary | ICD-10-CM | POA: Diagnosis not present

## 2015-05-23 DIAGNOSIS — R739 Hyperglycemia, unspecified: Secondary | ICD-10-CM | POA: Diagnosis not present

## 2015-05-23 DIAGNOSIS — K869 Disease of pancreas, unspecified: Secondary | ICD-10-CM | POA: Diagnosis not present

## 2015-05-23 DIAGNOSIS — Z7982 Long term (current) use of aspirin: Secondary | ICD-10-CM | POA: Diagnosis not present

## 2015-05-23 DIAGNOSIS — R627 Adult failure to thrive: Secondary | ICD-10-CM | POA: Diagnosis not present

## 2015-05-23 DIAGNOSIS — I130 Hypertensive heart and chronic kidney disease with heart failure and stage 1 through stage 4 chronic kidney disease, or unspecified chronic kidney disease: Secondary | ICD-10-CM | POA: Diagnosis not present

## 2015-05-23 DIAGNOSIS — F0280 Dementia in other diseases classified elsewhere without behavioral disturbance: Secondary | ICD-10-CM | POA: Diagnosis not present

## 2015-05-23 DIAGNOSIS — R2689 Other abnormalities of gait and mobility: Secondary | ICD-10-CM | POA: Diagnosis not present

## 2015-05-23 DIAGNOSIS — I509 Heart failure, unspecified: Secondary | ICD-10-CM | POA: Diagnosis not present

## 2015-05-23 DIAGNOSIS — R4182 Altered mental status, unspecified: Secondary | ICD-10-CM | POA: Diagnosis not present

## 2015-05-23 DIAGNOSIS — E1122 Type 2 diabetes mellitus with diabetic chronic kidney disease: Secondary | ICD-10-CM | POA: Diagnosis not present

## 2015-05-23 DIAGNOSIS — Z794 Long term (current) use of insulin: Secondary | ICD-10-CM | POA: Diagnosis not present

## 2015-05-23 DIAGNOSIS — E1165 Type 2 diabetes mellitus with hyperglycemia: Secondary | ICD-10-CM | POA: Diagnosis not present

## 2015-05-23 DIAGNOSIS — R7309 Other abnormal glucose: Secondary | ICD-10-CM | POA: Diagnosis not present

## 2015-05-23 DIAGNOSIS — N189 Chronic kidney disease, unspecified: Secondary | ICD-10-CM | POA: Diagnosis not present

## 2015-05-23 DIAGNOSIS — I2510 Atherosclerotic heart disease of native coronary artery without angina pectoris: Secondary | ICD-10-CM | POA: Diagnosis not present

## 2015-05-23 DIAGNOSIS — J9811 Atelectasis: Secondary | ICD-10-CM | POA: Diagnosis not present

## 2015-05-23 DIAGNOSIS — G20 Parkinson's disease: Secondary | ICD-10-CM | POA: Diagnosis not present

## 2015-05-23 DIAGNOSIS — I129 Hypertensive chronic kidney disease with stage 1 through stage 4 chronic kidney disease, or unspecified chronic kidney disease: Secondary | ICD-10-CM | POA: Diagnosis not present

## 2015-05-23 DIAGNOSIS — Z79899 Other long term (current) drug therapy: Secondary | ICD-10-CM | POA: Diagnosis not present

## 2015-05-23 DIAGNOSIS — F329 Major depressive disorder, single episode, unspecified: Secondary | ICD-10-CM | POA: Diagnosis not present

## 2015-05-24 DIAGNOSIS — R279 Unspecified lack of coordination: Secondary | ICD-10-CM | POA: Diagnosis not present

## 2015-05-24 DIAGNOSIS — Z7401 Bed confinement status: Secondary | ICD-10-CM | POA: Diagnosis not present

## 2015-05-28 DIAGNOSIS — N189 Chronic kidney disease, unspecified: Secondary | ICD-10-CM | POA: Diagnosis not present

## 2015-05-28 DIAGNOSIS — R4182 Altered mental status, unspecified: Secondary | ICD-10-CM | POA: Diagnosis not present

## 2015-05-28 DIAGNOSIS — R627 Adult failure to thrive: Secondary | ICD-10-CM | POA: Diagnosis not present

## 2015-05-28 DIAGNOSIS — R54 Age-related physical debility: Secondary | ICD-10-CM | POA: Diagnosis not present

## 2015-05-28 DIAGNOSIS — K760 Fatty (change of) liver, not elsewhere classified: Secondary | ICD-10-CM | POA: Diagnosis not present

## 2015-05-28 DIAGNOSIS — F0390 Unspecified dementia without behavioral disturbance: Secondary | ICD-10-CM | POA: Diagnosis not present

## 2015-05-28 DIAGNOSIS — R531 Weakness: Secondary | ICD-10-CM | POA: Diagnosis not present

## 2015-05-28 DIAGNOSIS — K869 Disease of pancreas, unspecified: Secondary | ICD-10-CM | POA: Diagnosis not present

## 2015-05-28 DIAGNOSIS — N390 Urinary tract infection, site not specified: Secondary | ICD-10-CM | POA: Diagnosis not present

## 2015-06-04 DIAGNOSIS — F0390 Unspecified dementia without behavioral disturbance: Secondary | ICD-10-CM | POA: Diagnosis not present

## 2015-06-04 DIAGNOSIS — M6281 Muscle weakness (generalized): Secondary | ICD-10-CM | POA: Diagnosis not present

## 2015-06-04 DIAGNOSIS — R296 Repeated falls: Secondary | ICD-10-CM | POA: Diagnosis not present

## 2015-06-04 DIAGNOSIS — R269 Unspecified abnormalities of gait and mobility: Secondary | ICD-10-CM | POA: Diagnosis not present

## 2015-06-04 DIAGNOSIS — R54 Age-related physical debility: Secondary | ICD-10-CM | POA: Diagnosis not present

## 2015-06-04 DIAGNOSIS — W19XXXA Unspecified fall, initial encounter: Secondary | ICD-10-CM | POA: Diagnosis not present

## 2015-06-05 DIAGNOSIS — N189 Chronic kidney disease, unspecified: Secondary | ICD-10-CM | POA: Diagnosis not present

## 2015-06-05 DIAGNOSIS — E1122 Type 2 diabetes mellitus with diabetic chronic kidney disease: Secondary | ICD-10-CM | POA: Diagnosis not present

## 2015-06-05 DIAGNOSIS — F0390 Unspecified dementia without behavioral disturbance: Secondary | ICD-10-CM | POA: Diagnosis not present

## 2015-06-05 DIAGNOSIS — R627 Adult failure to thrive: Secondary | ICD-10-CM | POA: Diagnosis not present

## 2015-06-05 DIAGNOSIS — Z7984 Long term (current) use of oral hypoglycemic drugs: Secondary | ICD-10-CM | POA: Diagnosis not present

## 2015-06-05 DIAGNOSIS — M6281 Muscle weakness (generalized): Secondary | ICD-10-CM | POA: Diagnosis not present

## 2015-06-05 DIAGNOSIS — I2510 Atherosclerotic heart disease of native coronary artery without angina pectoris: Secondary | ICD-10-CM | POA: Diagnosis not present

## 2015-06-05 DIAGNOSIS — Z9181 History of falling: Secondary | ICD-10-CM | POA: Diagnosis not present

## 2015-06-05 DIAGNOSIS — R296 Repeated falls: Secondary | ICD-10-CM | POA: Diagnosis not present

## 2015-06-05 DIAGNOSIS — G20 Parkinson's disease: Secondary | ICD-10-CM | POA: Diagnosis not present

## 2015-06-05 DIAGNOSIS — I130 Hypertensive heart and chronic kidney disease with heart failure and stage 1 through stage 4 chronic kidney disease, or unspecified chronic kidney disease: Secondary | ICD-10-CM | POA: Diagnosis not present

## 2015-06-05 DIAGNOSIS — Z794 Long term (current) use of insulin: Secondary | ICD-10-CM | POA: Diagnosis not present

## 2015-06-05 DIAGNOSIS — F329 Major depressive disorder, single episode, unspecified: Secondary | ICD-10-CM | POA: Diagnosis not present

## 2015-06-05 DIAGNOSIS — I509 Heart failure, unspecified: Secondary | ICD-10-CM | POA: Diagnosis not present

## 2015-06-05 DIAGNOSIS — Z7982 Long term (current) use of aspirin: Secondary | ICD-10-CM | POA: Diagnosis not present

## 2015-06-07 ENCOUNTER — Emergency Department (HOSPITAL_COMMUNITY): Payer: Medicare Other

## 2015-06-07 ENCOUNTER — Encounter (HOSPITAL_COMMUNITY): Payer: Self-pay | Admitting: Emergency Medicine

## 2015-06-07 ENCOUNTER — Inpatient Hospital Stay (HOSPITAL_COMMUNITY)
Admission: EM | Admit: 2015-06-07 | Discharge: 2015-06-10 | DRG: 065 | Disposition: A | Discharge status: Skilled Nursing Facility | Payer: Medicare Other | Attending: Internal Medicine | Admitting: Internal Medicine

## 2015-06-07 DIAGNOSIS — D72829 Elevated white blood cell count, unspecified: Secondary | ICD-10-CM

## 2015-06-07 DIAGNOSIS — H547 Unspecified visual loss: Secondary | ICD-10-CM | POA: Diagnosis present

## 2015-06-07 DIAGNOSIS — R001 Bradycardia, unspecified: Secondary | ICD-10-CM | POA: Diagnosis not present

## 2015-06-07 DIAGNOSIS — I63332 Cerebral infarction due to thrombosis of left posterior cerebral artery: Secondary | ICD-10-CM | POA: Diagnosis not present

## 2015-06-07 DIAGNOSIS — Z66 Do not resuscitate: Secondary | ICD-10-CM | POA: Diagnosis present

## 2015-06-07 DIAGNOSIS — I10 Essential (primary) hypertension: Secondary | ICD-10-CM

## 2015-06-07 DIAGNOSIS — S0990XA Unspecified injury of head, initial encounter: Secondary | ICD-10-CM | POA: Diagnosis not present

## 2015-06-07 DIAGNOSIS — I509 Heart failure, unspecified: Secondary | ICD-10-CM | POA: Diagnosis present

## 2015-06-07 DIAGNOSIS — E669 Obesity, unspecified: Secondary | ICD-10-CM | POA: Diagnosis present

## 2015-06-07 DIAGNOSIS — R29703 NIHSS score 3: Secondary | ICD-10-CM | POA: Diagnosis present

## 2015-06-07 DIAGNOSIS — I669 Occlusion and stenosis of unspecified cerebral artery: Secondary | ICD-10-CM | POA: Diagnosis not present

## 2015-06-07 DIAGNOSIS — R531 Weakness: Secondary | ICD-10-CM | POA: Diagnosis not present

## 2015-06-07 DIAGNOSIS — E785 Hyperlipidemia, unspecified: Secondary | ICD-10-CM | POA: Diagnosis present

## 2015-06-07 DIAGNOSIS — Z87891 Personal history of nicotine dependence: Secondary | ICD-10-CM

## 2015-06-07 DIAGNOSIS — Z7902 Long term (current) use of antithrombotics/antiplatelets: Secondary | ICD-10-CM

## 2015-06-07 DIAGNOSIS — R488 Other symbolic dysfunctions: Secondary | ICD-10-CM | POA: Diagnosis not present

## 2015-06-07 DIAGNOSIS — I4891 Unspecified atrial fibrillation: Secondary | ICD-10-CM

## 2015-06-07 DIAGNOSIS — I110 Hypertensive heart disease with heart failure: Secondary | ICD-10-CM | POA: Diagnosis present

## 2015-06-07 DIAGNOSIS — I480 Paroxysmal atrial fibrillation: Secondary | ICD-10-CM | POA: Diagnosis not present

## 2015-06-07 DIAGNOSIS — N179 Acute kidney failure, unspecified: Secondary | ICD-10-CM | POA: Diagnosis not present

## 2015-06-07 DIAGNOSIS — E875 Hyperkalemia: Secondary | ICD-10-CM

## 2015-06-07 DIAGNOSIS — I663 Occlusion and stenosis of cerebellar arteries: Secondary | ICD-10-CM | POA: Diagnosis not present

## 2015-06-07 DIAGNOSIS — I350 Nonrheumatic aortic (valve) stenosis: Secondary | ICD-10-CM | POA: Diagnosis present

## 2015-06-07 DIAGNOSIS — I63431 Cerebral infarction due to embolism of right posterior cerebral artery: Secondary | ICD-10-CM | POA: Diagnosis not present

## 2015-06-07 DIAGNOSIS — K760 Fatty (change of) liver, not elsewhere classified: Secondary | ICD-10-CM | POA: Diagnosis present

## 2015-06-07 DIAGNOSIS — R5381 Other malaise: Secondary | ICD-10-CM | POA: Diagnosis not present

## 2015-06-07 DIAGNOSIS — E119 Type 2 diabetes mellitus without complications: Secondary | ICD-10-CM | POA: Diagnosis present

## 2015-06-07 DIAGNOSIS — I63439 Cerebral infarction due to embolism of unspecified posterior cerebral artery: Secondary | ICD-10-CM | POA: Diagnosis not present

## 2015-06-07 DIAGNOSIS — R197 Diarrhea, unspecified: Secondary | ICD-10-CM

## 2015-06-07 DIAGNOSIS — I63432 Cerebral infarction due to embolism of left posterior cerebral artery: Secondary | ICD-10-CM | POA: Diagnosis not present

## 2015-06-07 DIAGNOSIS — I482 Chronic atrial fibrillation: Secondary | ICD-10-CM | POA: Diagnosis not present

## 2015-06-07 DIAGNOSIS — F0390 Unspecified dementia without behavioral disturbance: Secondary | ICD-10-CM | POA: Diagnosis present

## 2015-06-07 DIAGNOSIS — R945 Abnormal results of liver function studies: Secondary | ICD-10-CM

## 2015-06-07 DIAGNOSIS — Z9181 History of falling: Secondary | ICD-10-CM

## 2015-06-07 DIAGNOSIS — R404 Transient alteration of awareness: Secondary | ICD-10-CM | POA: Diagnosis not present

## 2015-06-07 DIAGNOSIS — R2689 Other abnormalities of gait and mobility: Secondary | ICD-10-CM | POA: Diagnosis not present

## 2015-06-07 DIAGNOSIS — R7989 Other specified abnormal findings of blood chemistry: Secondary | ICD-10-CM | POA: Diagnosis present

## 2015-06-07 DIAGNOSIS — Z6829 Body mass index (BMI) 29.0-29.9, adult: Secondary | ICD-10-CM | POA: Diagnosis not present

## 2015-06-07 DIAGNOSIS — Z794 Long term (current) use of insulin: Secondary | ICD-10-CM | POA: Diagnosis not present

## 2015-06-07 DIAGNOSIS — I639 Cerebral infarction, unspecified: Secondary | ICD-10-CM | POA: Diagnosis not present

## 2015-06-07 DIAGNOSIS — I6350 Cerebral infarction due to unspecified occlusion or stenosis of unspecified cerebral artery: Secondary | ICD-10-CM

## 2015-06-07 DIAGNOSIS — Z7982 Long term (current) use of aspirin: Secondary | ICD-10-CM | POA: Diagnosis not present

## 2015-06-07 DIAGNOSIS — N390 Urinary tract infection, site not specified: Secondary | ICD-10-CM | POA: Diagnosis present

## 2015-06-07 DIAGNOSIS — R2681 Unsteadiness on feet: Secondary | ICD-10-CM | POA: Diagnosis not present

## 2015-06-07 DIAGNOSIS — F0391 Unspecified dementia with behavioral disturbance: Secondary | ICD-10-CM | POA: Diagnosis not present

## 2015-06-07 DIAGNOSIS — I63433 Cerebral infarction due to embolism of bilateral posterior cerebral arteries: Secondary | ICD-10-CM | POA: Diagnosis not present

## 2015-06-07 DIAGNOSIS — R4182 Altered mental status, unspecified: Secondary | ICD-10-CM | POA: Diagnosis not present

## 2015-06-07 DIAGNOSIS — I69359 Hemiplegia and hemiparesis following cerebral infarction affecting unspecified side: Secondary | ICD-10-CM | POA: Diagnosis not present

## 2015-06-07 DIAGNOSIS — Z87440 Personal history of urinary (tract) infections: Secondary | ICD-10-CM | POA: Diagnosis not present

## 2015-06-07 DIAGNOSIS — R296 Repeated falls: Secondary | ICD-10-CM | POA: Diagnosis not present

## 2015-06-07 DIAGNOSIS — I6623 Occlusion and stenosis of bilateral posterior cerebral arteries: Secondary | ICD-10-CM | POA: Diagnosis not present

## 2015-06-07 DIAGNOSIS — M6281 Muscle weakness (generalized): Secondary | ICD-10-CM | POA: Diagnosis not present

## 2015-06-07 DIAGNOSIS — I6523 Occlusion and stenosis of bilateral carotid arteries: Secondary | ICD-10-CM | POA: Diagnosis not present

## 2015-06-07 DIAGNOSIS — I6789 Other cerebrovascular disease: Secondary | ICD-10-CM | POA: Diagnosis not present

## 2015-06-07 HISTORY — DX: Heart failure, unspecified: I50.9

## 2015-06-07 HISTORY — DX: Unspecified dementia, unspecified severity, without behavioral disturbance, psychotic disturbance, mood disturbance, and anxiety: F03.90

## 2015-06-07 HISTORY — DX: Essential (primary) hypertension: I10

## 2015-06-07 HISTORY — DX: Hyperlipidemia, unspecified: E78.5

## 2015-06-07 HISTORY — DX: Nonrheumatic aortic (valve) stenosis: I35.0

## 2015-06-07 HISTORY — DX: Unspecified atrial fibrillation: I48.91

## 2015-06-07 HISTORY — DX: Type 2 diabetes mellitus without complications: E11.9

## 2015-06-07 LAB — CBC WITH DIFFERENTIAL/PLATELET
Basophils Absolute: 0 10*3/uL (ref 0.0–0.1)
Basophils Relative: 0 %
EOS PCT: 2 %
Eosinophils Absolute: 0.3 10*3/uL (ref 0.0–0.7)
HEMATOCRIT: 39 % (ref 39.0–52.0)
HEMOGLOBIN: 12.7 g/dL — AB (ref 13.0–17.0)
LYMPHS ABS: 2.2 10*3/uL (ref 0.7–4.0)
LYMPHS PCT: 17 %
MCH: 27.8 pg (ref 26.0–34.0)
MCHC: 32.6 g/dL (ref 30.0–36.0)
MCV: 85.3 fL (ref 78.0–100.0)
MONO ABS: 1 10*3/uL (ref 0.1–1.0)
MONOS PCT: 8 %
NEUTROS ABS: 9.9 10*3/uL — AB (ref 1.7–7.7)
Neutrophils Relative %: 73 %
Platelets: 317 10*3/uL (ref 150–400)
RBC: 4.57 MIL/uL (ref 4.22–5.81)
RDW: 13.6 % (ref 11.5–15.5)
WBC: 13.5 10*3/uL — ABNORMAL HIGH (ref 4.0–10.5)

## 2015-06-07 LAB — URINALYSIS, ROUTINE W REFLEX MICROSCOPIC
Bilirubin Urine: NEGATIVE
GLUCOSE, UA: NEGATIVE mg/dL
HGB URINE DIPSTICK: NEGATIVE
Ketones, ur: NEGATIVE mg/dL
Nitrite: NEGATIVE
PROTEIN: NEGATIVE mg/dL
SPECIFIC GRAVITY, URINE: 1.019 (ref 1.005–1.030)
pH: 5 (ref 5.0–8.0)

## 2015-06-07 LAB — COMPREHENSIVE METABOLIC PANEL
ALBUMIN: 3 g/dL — AB (ref 3.5–5.0)
ALT: 18 U/L (ref 17–63)
ANION GAP: 5 (ref 5–15)
AST: 38 U/L (ref 15–41)
Alkaline Phosphatase: 63 U/L (ref 38–126)
BILIRUBIN TOTAL: 3 mg/dL — AB (ref 0.3–1.2)
BUN: 29 mg/dL — AB (ref 6–20)
CHLORIDE: 105 mmol/L (ref 101–111)
CO2: 25 mmol/L (ref 22–32)
Calcium: 9.8 mg/dL (ref 8.9–10.3)
Creatinine, Ser: 1.52 mg/dL — ABNORMAL HIGH (ref 0.61–1.24)
GFR calc Af Amer: 46 mL/min — ABNORMAL LOW (ref >60)
GFR calc non Af Amer: 39 mL/min — ABNORMAL LOW (ref >60)
GLUCOSE: 112 mg/dL — AB (ref 65–99)
POTASSIUM: 5.5 mmol/L — AB (ref 3.5–5.1)
SODIUM: 135 mmol/L (ref 135–145)
TOTAL PROTEIN: 6.6 g/dL (ref 6.5–8.1)

## 2015-06-07 LAB — PROTIME-INR
INR: 1.3 (ref 0.00–1.49)
INR: 1.36 (ref 0.00–1.49)
PROTHROMBIN TIME: 16.9 s — AB (ref 11.6–15.2)
Prothrombin Time: 16.4 seconds — ABNORMAL HIGH (ref 11.6–15.2)

## 2015-06-07 LAB — URINE MICROSCOPIC-ADD ON

## 2015-06-07 LAB — I-STAT CG4 LACTIC ACID, ED: LACTIC ACID, VENOUS: 1.72 mmol/L (ref 0.5–2.0)

## 2015-06-07 MED ORDER — CLOPIDOGREL BISULFATE 75 MG PO TABS
75.0000 mg | ORAL_TABLET | Freq: Every day | ORAL | Status: DC
  Administered 2015-06-08 – 2015-06-10 (×3): 75 mg via ORAL
  Filled 2015-06-07 (×3): qty 1

## 2015-06-07 MED ORDER — DONEPEZIL HCL 10 MG PO TABS
10.0000 mg | ORAL_TABLET | Freq: Every day | ORAL | Status: DC
Start: 2015-06-07 — End: 2015-06-10
  Administered 2015-06-08 – 2015-06-09 (×3): 10 mg via ORAL
  Filled 2015-06-07 (×3): qty 1

## 2015-06-07 MED ORDER — INSULIN GLARGINE 100 UNIT/ML ~~LOC~~ SOLN
10.0000 [IU] | Freq: Every day | SUBCUTANEOUS | Status: DC
  Administered 2015-06-08 – 2015-06-09 (×3): 10 [IU] via SUBCUTANEOUS
  Filled 2015-06-07 (×4): qty 0.1

## 2015-06-07 MED ORDER — AMLODIPINE BESYLATE 5 MG PO TABS
5.0000 mg | ORAL_TABLET | Freq: Every day | ORAL | Status: DC
  Administered 2015-06-08 – 2015-06-10 (×3): 5 mg via ORAL
  Filled 2015-06-07 (×3): qty 1

## 2015-06-07 MED ORDER — STROKE: EARLY STAGES OF RECOVERY BOOK
Freq: Once | Status: AC
  Administered 2015-06-07: 1
  Filled 2015-06-07: qty 1

## 2015-06-07 MED ORDER — SODIUM CHLORIDE 0.9 % IV SOLN
INTRAVENOUS | Status: AC
  Administered 2015-06-08: via INTRAVENOUS

## 2015-06-07 MED ORDER — INSULIN ASPART 100 UNIT/ML ~~LOC~~ SOLN
0.0000 [IU] | Freq: Three times a day (TID) | SUBCUTANEOUS | Status: DC
Start: 2015-06-08 — End: 2015-06-10
  Administered 2015-06-09: 2 [IU] via SUBCUTANEOUS
  Administered 2015-06-09: 1 [IU] via SUBCUTANEOUS
  Administered 2015-06-09: 3 [IU] via SUBCUTANEOUS
  Administered 2015-06-10 (×2): 2 [IU] via SUBCUTANEOUS

## 2015-06-07 MED ORDER — QUETIAPINE FUMARATE 25 MG PO TABS
25.0000 mg | ORAL_TABLET | Freq: Every day | ORAL | Status: DC
  Administered 2015-06-08 – 2015-06-09 (×3): 25 mg via ORAL
  Filled 2015-06-07 (×3): qty 1

## 2015-06-07 MED ORDER — DEXTROSE 5 % IV SOLN
1.0000 g | INTRAVENOUS | Status: DC
  Administered 2015-06-08 (×2): 1 g via INTRAVENOUS
  Filled 2015-06-07 (×3): qty 10

## 2015-06-07 MED ORDER — MEMANTINE HCL ER 28 MG PO CP24
28.0000 mg | ORAL_CAPSULE | Freq: Every day | ORAL | Status: DC
  Administered 2015-06-08 – 2015-06-10 (×3): 28 mg via ORAL
  Filled 2015-06-07 (×3): qty 1

## 2015-06-07 MED ORDER — SODIUM CHLORIDE 0.9 % IV BOLUS (SEPSIS)
1000.0000 mL | Freq: Once | INTRAVENOUS | Status: AC
  Administered 2015-06-07: 1000 mL via INTRAVENOUS

## 2015-06-07 MED ORDER — ACETAMINOPHEN 325 MG PO TABS: 650.0000 mg | ORAL_TABLET | ORAL | Status: DC | PRN

## 2015-06-07 MED ORDER — SODIUM CHLORIDE 0.9 % IV SOLN
INTRAVENOUS | Status: DC
  Administered 2015-06-07: 20:00:00 via INTRAVENOUS

## 2015-06-07 MED ORDER — ASPIRIN 81 MG PO CHEW
162.0000 mg | CHEWABLE_TABLET | Freq: Every day | ORAL | Status: DC
  Administered 2015-06-08 – 2015-06-10 (×3): 162 mg via ORAL
  Filled 2015-06-07 (×3): qty 2

## 2015-06-07 MED ORDER — ACETAMINOPHEN 650 MG RE SUPP: 650.0000 mg | RECTAL | Status: DC | PRN

## 2015-06-07 MED ORDER — ATORVASTATIN CALCIUM 40 MG PO TABS
40.0000 mg | ORAL_TABLET | Freq: Every day | ORAL | Status: DC
  Administered 2015-06-08: 40 mg via ORAL
  Filled 2015-06-07: qty 1

## 2015-06-07 MED ORDER — CITALOPRAM HYDROBROMIDE 20 MG PO TABS
20.0000 mg | ORAL_TABLET | Freq: Every day | ORAL | Status: DC
  Administered 2015-06-08 – 2015-06-10 (×3): 20 mg via ORAL
  Filled 2015-06-07 (×3): qty 1

## 2015-06-07 NOTE — Progress Notes (Signed)
Pharmacy Antibiotic Note  Ronnie Weeks is a 82 y.o. male admitted on 06/07/2015 with UTI.  Pharmacy has been consulted for Rocephin dosing.  Plan: Rocephin 1g IV Q24H.  Temp (24hrs), Avg:98.7 F (37.1 C), Min:98.7 F (37.1 C), Max:98.7 F (37.1 C)   Recent Labs Lab 06/07/15 1930 06/07/15 1950  WBC 13.5*  --   CREATININE 1.52*  --   LATICACIDVEN  --  1.72     No Known Allergies    Thank you for allowing pharmacy to be a part of this patient's care.  Wynona Neat, PharmD, BCPS  06/07/2015 11:25 PM

## 2015-06-07 NOTE — H&P (Signed)
History and Physical    Ronnie Weeks [id number] DOB: [birth year] DOA: 06/07/2015  PCP: No primary care provider on file.   Patient coming from: Assisted living facility  Chief Complaint: Increased weakness, falls, head trauma today  HPI: Ronnie Weeks is a 82 y.o. gentleman with a history of HTN, HLD, IDDM, reported atrial fibrillation (does not appear to be anticoagulated), CHF (type unknown), and dementia (only oriented to self) who is unable to give any meaningful history at this time.  This H/P taken from limited EMR and interview with his sister-in-law/POA Ronnie Weeks by phone.  This is his first admission to this hospital; he has been admitted to [facility] in the past.  Essentially, he has 2-3 weeks of gradual decline, including decreased mobility, decreased appetite, and intermittent noncompliance with taking his oral medications (he sometimes spits them out).  He has had recurrent falls.  He was been evaluated by outpatient providers at the request of his ALF, and he was recently diagnosed with a UTI and prescribed amoxicillin for it.  His family had also been told that he was no longer appropriate for assisted living due to his level of debility and were given 30 days to make arrangements for SNF.  Today, he had a fall while the patient care techs were trying to get him up.  He his his head on the night stand.  This is what prompted transfer to the ED this evening.   ED Course: Unfortunately, CT of the head shows subacute CVA in the left PCA distribution.  He also has evidence of AKI with mild hyperkalemia (no EKG changes), sinus bradycardia, and persistent UTI.  He has been evaluated by the neurohospitalist and needs admission for complete stroke work-up.  Review of Systems: Unable to obtain due to patient's dementia.  Past Medical History  Diagnosis Date  . Diabetes mellitus without complication ([place])   . Hypertension   . Hyperlipidemia   . CHF (congestive heart failure) ([place])   . Aortic  valve stenosis   . A-fib ([place])   . Dementia     Past Surgical History  Procedure Laterality Date  . Appendectomy    . Cholecystectomy    . Elbow fracture surgery       reports that he has quit smoking. His smoking use included Pipe. He does not have any smokeless tobacco history on file. He reports that he does not drink alcohol or use illicit drugs. He is a widower.  Has been in an assisted living facility since 2009.  Remote use of pipe.  No EtOH or illicit drug use.  He was actually married to Startup sister.  No Known Allergies  Family History  Problem Relation Age of Onset  . Parkinson's disease Mother   . Heart disease Father   . Brain cancer Sister   . Prostate cancer Brother     Prior to Admission medications   Medication Sig Start Date End Date Taking? Authorizing Provider  acetaminophen (TYLENOL) 500 MG tablet Take 1,000 mg by mouth every 4 (four) hours as needed for mild pain or headache. Do not exceed 3 doses in 24 hours   Yes Historical Provider, MD  amLODipine (NORVASC) 5 MG tablet Take 5 mg by mouth daily. 05/06/15  Yes Historical Provider, MD  ammonium lactate (LAC-HYDRIN) 12 % lotion Apply 1 application topically 2 (two) times daily. Apply to arms, legs and trunk   Yes Historical Provider, MD  amoxicillin (AMOXIL) 500 MG capsule Take 2,000 mg by mouth See admin  instructions. Take 4 capsules (2000 mg) by mouth one hour prior to dental appointment 05/25/15  Yes Historical Provider, MD  aspirin 81 MG chewable tablet Chew 162 mg by mouth daily.   Yes Historical Provider, MD  atorvastatin (LIPITOR) 40 MG tablet Take 40 mg by mouth daily. 05/19/15  Yes Historical Provider, MD  Calcium & Magnesium Carbonates (MYLANTA PO) Take 30 mLs by mouth 4 (four) times daily as needed (heartburn/ indigestion).   Yes Historical Provider, MD  carvedilol (COREG) 25 MG tablet Take 25 mg by mouth 2 (two) times daily. 05/19/15  Yes Historical Provider, MD  Cholecalciferol (VITAMIN D3) 5000  units CAPS Take 5,000 Units by mouth daily.   Yes Historical Provider, MD  citalopram (CELEXA) 20 MG tablet Take 20 mg by mouth daily. 05/06/15  Yes Historical Provider, MD  clopidogrel (PLAVIX) 75 MG tablet Take 75 mg by mouth daily. 05/19/15  Yes Historical Provider, MD  Cyanocobalamin (VITAMIN B-12) 1000 MCG SUBL Place 1,000 mcg under the tongue 2 (two) times daily.   Yes Historical Provider, MD  docusate sodium (COLACE) 100 MG capsule Take 100 mg by mouth 2 (two) times daily as needed for mild constipation.   Yes Historical Provider, MD  donepezil (ARICEPT) 10 MG tablet Take 10 mg by mouth at bedtime.  05/12/15  Yes Historical Provider, MD  furosemide (LASIX) 20 MG tablet Take 20 mg by mouth daily. 06/02/15  Yes Historical Provider, MD  guaiFENesin (DIABETIC TUSSIN EX) 100 MG/5ML liquid Take 200 mg by mouth every 6 (six) hours as needed for cough.   Yes Historical Provider, MD  Hydrocortisone-Aloe Vera 0.5 % CREA Apply 1 application topically every 6 (six) hours as needed (itching/ rash).   Yes Historical Provider, MD  insulin glargine (LANTUS) 100 UNIT/ML injection Inject 28 Units into the skin at bedtime.   Yes Historical Provider, MD  loperamide (IMODIUM A-D) 2 MG tablet Take 2 mg by mouth 4 (four) times daily as needed for diarrhea or loose stools.   Yes Historical Provider, MD  magnesium hydroxide (MILK OF MAGNESIA) 400 MG/5ML suspension Take 30 mLs by mouth daily as needed (constipation).   Yes Historical Provider, MD  memantine (NAMENDA XR) 28 MG CP24 24 hr capsule Take 28 mg by mouth daily.   Yes Historical Provider, MD  metFORMIN (GLUCOPHAGE) 1000 MG tablet Take 1,000 mg by mouth 2 (two) times daily. 06/02/15  Yes Historical Provider, MD  Multiple Vitamin (MULTIVITAMIN WITH MINERALS) TABS tablet Take 1 tablet by mouth daily.   Yes Historical Provider, MD  multivitamin-lutein (OCUVITE-LUTEIN) CAPS capsule Take 1 capsule by mouth daily.   Yes Historical Provider, MD  Neomycin-Bacitracin-Polymyxin  (TRIPLE ANTIBIOTIC) 3.5-6030393719 OINT Apply 1 application topically every 12 (twelve) hours as needed (skin abrasions and minor skin tears).   Yes Historical Provider, MD  Psyllium (REGULOID PO) Take 5 mLs by mouth daily. Mix in 4 oz of suitable liquid and drink   Yes Historical Provider, MD  QUEtiapine (SEROQUEL) 25 MG tablet Take 25 mg by mouth at bedtime.  05/26/15  Yes Historical Provider, MD  sitaGLIPtin (JANUVIA) 100 MG tablet Take 100 mg by mouth daily.   Yes Historical Provider, MD  valsartan (DIOVAN) 40 MG tablet Take 40 mg by mouth daily. 05/26/15  Yes Historical Provider, MD    Physical Exam: Filed Vitals:   06/07/15 1901 06/07/15 1926 06/07/15 2045 06/07/15 2200  BP: 135/55  117/70 143/61  Pulse: 50  55 53  Temp:  98.7 F (37.1 C)  TempSrc:  Rectal    Resp:   17 11  SpO2: 100%  99% 99%      Constitutional: NAD, calm, comfortable, but only oriented to person  Filed Vitals:   06/07/15 1901 06/07/15 1926 06/07/15 2045 06/07/15 2200  BP: 135/55  117/70 143/61  Pulse: 50  55 53  Temp:  98.7 F (37.1 C)    TempSrc:  Rectal    Resp:   17 11  SpO2: 100%  99% 99%   Eyes: PERRL, lids and conjunctivae normal ENMT: Mucous membranes are moist. Posterior pharynx clear of any exudate or lesions.Normal dentition.  Neck: thick but supple, no apparent masses Respiratory: Normal respiratory effort. No accessory muscle use. CTA listening anteriorly. Cardiovascular: Bradycardic but regular.  2/6 systolic murmur heard best at LUSB.  No extremity edema. 2+ pedal pulses. Abdomen: Obese/protuberant but soft and compressible.  Bowel sounds present.  No apparent masses. Musculoskeletal: no clubbing / cyanosis. No joint deformity upper and lower extremities. Good ROM, no contractures. Normal muscle tone.  Skin: no rashes, warm, dry Neurologic: CN 2-12 grossly intact. Sensation intact, Strength 5/5 in all 4.  Psychiatric: Impaired judgement, insight due to dementia but pleasant.  Only  oriented to person at this time.    Labs on Admission: I have personally reviewed following labs and imaging studies  CBC:  Recent Labs Lab 06/07/15 1930  WBC 13.5*  NEUTROABS 9.9*  HGB 12.7*  HCT 39.0  MCV 85.3  PLT [id number]   Basic Metabolic Panel:  Recent Labs Lab 06/07/15 1930  NA 135  K 5.5*  CL 105  CO2 25  GLUCOSE 112*  BUN 29*  CREATININE 1.52*  CALCIUM 9.8   GFR: CrCl cannot be calculated (Unknown ideal weight.). Liver Function Tests:  Recent Labs Lab 06/07/15 1930  AST 38  ALT 18  ALKPHOS 63  BILITOT 3.0*  PROT 6.6  ALBUMIN 3.0*   Coagulation Profile:  Recent Labs Lab 06/07/15 2209  INR 1.30   Urine analysis:    Component Value Date/Time   COLORURINE YELLOW 06/07/2015 1930   APPEARANCEUR CLEAR 06/07/2015 1930   LABSPEC 1.019 06/07/2015 1930   PHURINE 5.0 06/07/2015 1930   GLUCOSEU NEGATIVE 06/07/2015 1930   HGBUR NEGATIVE 06/07/2015 1930   [place] NEGATIVE 06/07/2015 1930   KETONESUR NEGATIVE 06/07/2015 1930   PROTEINUR NEGATIVE 06/07/2015 1930   NITRITE NEGATIVE 06/07/2015 1930   LEUKOCYTESUR LARGE* 06/07/2015 1930   Sepsis Labs:  Lactic acid level 1.72  Radiological Exams on Admission: Ct Head Wo Contrast  06/07/2015  CLINICAL DATA:  Increasing lethargy for 12 hours. Fall today. Recent urinary tract infection. EXAM: CT HEAD WITHOUT CONTRAST TECHNIQUE: Contiguous axial images were obtained from the base of the skull through the vertex without intravenous contrast. COMPARISON:  None. FINDINGS: Brain: There is no evidence of acute intracranial hemorrhage, mass lesion or acute extra-axial fluid collection. There is a possible arachnoid cyst in the left middle cranial fossa. The ventricles and subarachnoid spaces are diffusely prominent, consistent with atrophy. There is ill-defined low-density in the left parietal occipital lobes suspicious for a subacute stroke in the left PCA distribution. There are mild chronic small vessel ischemic  changes in the periventricular white matter. There is no hydrocephalus. Intracranial vascular calcifications are present. Bones/sinuses/visualized face: There is mucosal thickening in the maxillary and ethmoid sinuses bilaterally with a possible right maxillary sinus air-fluid level. The visualized paranasal sinuses, mastoid air cells and middle ears are otherwise clear. The calvarium is intact. IMPRESSION: 1. Ill-defined  low-density in the left parietal occipital lobe suspicious for a subacute nonhemorrhagic stroke in the PCA distribution. 2. Atrophy and mild chronic small vessel ischemic changes. 3. Paranasal sinus mucosal thickening with possible right maxillary sinus air-fluid level. Electronically Signed   By: Richardean Sale M.D.   On: 06/07/2015 20:49   Dg Chest Port 1 View  06/07/2015  CLINICAL DATA:  Acute mental status change EXAM: PORTABLE CHEST 1 VIEW COMPARISON:  None. FINDINGS: The heart size and mediastinal contours are within normal limits. Both lungs are clear. The visualized skeletal structures are unremarkable. IMPRESSION: No active disease. Electronically Signed   By: Dorise Bullion III M.D   On: 06/07/2015 21:27    EKG: Independently reviewed. Sinus bradycardia, HR 52.  No acute ST segment changes  Assessment/Plan Principal Problem:   Stroke [facility]) Active Problems:   UTI (lower urinary tract infection)   Leukocytosis   AKI (acute kidney injury) (HCC)   Abnormal bilirubin test   Hyperkalemia   Diarrhea   Bradycardia   Dementia   HTN (hypertension)   Diabetes (HCC)   Atrial fibrillation (HCC)   CHF (congestive heart failure) (HCC)   CVA (cerebral infarction)    Subacute CVA --Neurology consult greatly appreciated --MRI/MRA, carotid dopplers, complete echo per protocol --Telemetry monitoring --Bedside swallow eval tonight, PT/OT/Speech consults in the AM --Continue home doses of aspirin, plavix, statin for now --Check A1c and fasting lipid panel  AKI with  hyperkalemia --Hydration with NS, follow BMP --Hold lasix and ARB for now  UTI --IV Rocephin --Urine culture pending  Diarrhea --Enteric precautions started in ED --Stool C diff sample rejected by the lab --Monitor for now; if persistent, consider GI pathogens panel  Bilirubin of 3 of unclear etiology --RUQ ultrasound --Repeat LFTs in the AM  Bradycardia --Hold beta blocker now and observe  DM --Reduce lantus dose to 10units qHS since PO intake has been poor --Hold Januvia and metformin now --SSI coverage AC/HS  History of dementia --Continue aricept and namenda  HTN --Continue amlodipine   DVT prophylaxis: SCDs Code Status: DNR/DNI Family Communication: Spoke with sister-in-law Ronnie Weeks, who is also his POA, by phone [phone] Disposition Plan: Will need SNF Consults called: Neurohospitalist (Dr. Nicole Kindred) evaluated the patient in the ED Admission status: Inpatient telemetry   Eber Jones MD [facility]  If 7PM-7AM, please contact night-coverage [email or web address] Password [email or web address]  06/07/2015, 11:40 PM

## 2015-06-07 NOTE — ED Notes (Signed)
Lab called and would not accept stool sample for CDIFF because it didn't form to cup. MD aware.

## 2015-06-07 NOTE — ED Provider Notes (Signed)
CSN: [id number]     Arrival date & time 06/07/15  1847 History   First MD Initiated Contact with Patient 06/07/15 1850     Chief Complaint  Patient presents with  . Altered Mental Status     HPI  Patient presents from nursing facility with staff concerns of confusion. Patient himself denies any pain, discomfort, nausea. Patient is disoriented, aware of self, roughly to place, not to time. Nursing home reports the patient has baseline dementia, but seems more confused than usual or Patient is currently receiving therapy for urinary tract infection. No other report of new illness, medication. Level V caveat secondary to the patient's cognitive state.   Past Medical History  Diagnosis Date  . Diabetes mellitus without complication ([place])   . Hypertension   . Hyperlipidemia   . CHF (congestive heart failure) ([place])   . Aortic valve stenosis   . A-fib ([place])   . Dementia    History reviewed. No pertinent past surgical history. No family history on file. Social History  Substance Use Topics  . Smoking status: Former Research scientist (life sciences)  . Smokeless tobacco: None  . Alcohol Use: No    Review of Systems  Unable to perform ROS: Dementia      Allergies  Review of patient's allergies indicates no known allergies.  Home Medications   Prior to Admission medications   Not on File   BP 117/70 mmHg  Pulse 55  Temp(Src) 98.7 F (37.1 C) (Rectal)  Resp 17  SpO2 99% Physical Exam  Constitutional: He appears well-developed. No distress.  HENT:  Head: Normocephalic and atraumatic.  Eyes: Conjunctivae and EOM are normal.  Cardiovascular: Normal rate and regular rhythm.   Pulmonary/Chest: Effort normal. No stridor. No respiratory distress.  Abdominal: He exhibits no distension.  Musculoskeletal: He exhibits no edema.  Neurological: He is alert. He displays atrophy. No cranial nerve deficit or sensory deficit. He exhibits normal muscle tone.  Patient follows commands inconsistently, but  MAES.  Speech is clear, brief  Skin: Skin is warm and dry.  Psychiatric: His speech is delayed. He is withdrawn. Cognition and memory are impaired.  Nursing note and vitals reviewed.   ED Course  Procedures (including critical care time) Labs Review Labs Reviewed  COMPREHENSIVE METABOLIC PANEL - Abnormal; Notable for the following:    Potassium 5.5 (*)    Glucose, Bld 112 (*)    BUN 29 (*)    Creatinine, Ser 1.52 (*)    Albumin 3.0 (*)    Total Bilirubin 3.0 (*)    GFR calc non Af Amer 39 (*)    GFR calc Af Amer 46 (*)    All other components within normal limits  CBC WITH DIFFERENTIAL/PLATELET - Abnormal; Notable for the following:    WBC 13.5 (*)    Hemoglobin 12.7 (*)    Neutro Abs 9.9 (*)    All other components within normal limits  URINALYSIS, ROUTINE W REFLEX MICROSCOPIC (NOT AT [facility]) - Abnormal; Notable for the following:    Leukocytes, UA LARGE (*)    All other components within normal limits  URINE MICROSCOPIC-ADD ON - Abnormal; Notable for the following:    Squamous Epithelial / LPF 0-5 (*)    Bacteria, UA FEW (*)    Casts HYALINE CASTS (*)    All other components within normal limits  URINE CULTURE  PROTIME-INR  PROTIME-INR  I-STAT CG4 LACTIC ACID, ED    Imaging Review Ct Head Wo Contrast  06/07/2015  CLINICAL DATA:  Increasing lethargy for 12 hours. Fall today. Recent urinary tract infection. EXAM: CT HEAD WITHOUT CONTRAST TECHNIQUE: Contiguous axial images were obtained from the base of the skull through the vertex without intravenous contrast. COMPARISON:  None. FINDINGS: Brain: There is no evidence of acute intracranial hemorrhage, mass lesion or acute extra-axial fluid collection. There is a possible arachnoid cyst in the left middle cranial fossa. The ventricles and subarachnoid spaces are diffusely prominent, consistent with atrophy. There is ill-defined low-density in the left parietal occipital lobes suspicious for a subacute stroke in the left PCA  distribution. There are mild chronic small vessel ischemic changes in the periventricular white matter. There is no hydrocephalus. Intracranial vascular calcifications are present. Bones/sinuses/visualized face: There is mucosal thickening in the maxillary and ethmoid sinuses bilaterally with a possible right maxillary sinus air-fluid level. The visualized paranasal sinuses, mastoid air cells and middle ears are otherwise clear. The calvarium is intact. IMPRESSION: 1. Ill-defined low-density in the left parietal occipital lobe suspicious for a subacute nonhemorrhagic stroke in the PCA distribution. 2. Atrophy and mild chronic small vessel ischemic changes. 3. Paranasal sinus mucosal thickening with possible right maxillary sinus air-fluid level. Electronically Signed   By: Richardean Sale M.D.   On: 06/07/2015 20:49   Dg Chest Port 1 View  06/07/2015  CLINICAL DATA:  Acute mental status change EXAM: PORTABLE CHEST 1 VIEW COMPARISON:  None. FINDINGS: The heart size and mediastinal contours are within normal limits. Both lungs are clear. The visualized skeletal structures are unremarkable. IMPRESSION: No active disease. Electronically Signed   By: Dorise Bullion III M.D   On: 06/07/2015 21:27   I have personally reviewed and evaluated these images and lab results as part of my medical decision-making.   EKG Interpretation   Date/Time:  Sunday June 07 2015 18:57:39 EDT Ventricular Rate:  52 PR Interval:  245 QRS Duration: 97 QT Interval:  418 QTC Calculation: 389 R Axis:   -62 Text Interpretation:  Sinus rhythm Low voltage QRS T wave abnormality  Artifact Abnormal ekg Confirmed by Carmin Muskrat  MD ([id number]) on 06/07/2015  7:02:35 PM     On repeat exam the patient is calm. I have discussed this case with our neurology colleagues. With concern for subacute stroke he had repeat evaluation with neurology. Patient's lower extremity reflexes are present, seemingly has lost visual capacity  bilaterally. Recommendation for admission.  MDM  Patient presents from nursing home with concern for changes in cognition. Here the patient is awake, disoriented, and has new neurologic deficits, including vision loss, though his history, evaluation is complicated by his memory loss. Initial labs notable for hyperkalemia, mild leukocytosis, and CT scan demonstrates subacute infarct. Patient received fluid resuscitation, and after discussion with neurology was admitted for further evaluation, management.   Carmin Muskrat, MD 06/07/15 2202

## 2015-06-07 NOTE — Consult Note (Signed)
Admission H&P    Chief Complaint: Altered mental status with lethargy.  HPI: Ronnie Weeks is an 82 y.o. male with a history of diabetes mellitus, hypertension, hyperlipidemia, atrial fibrillation and dementia, brought to the emergency room for evaluation of altered mental status with increasing lethargy for about 12 hours. Patient was afebrile. He is currently being treated for urinary tract infection. CT scan of his head showed low density area involving the left medial parietal and occipital areas, indicative of subacute PCA territory stroke. No weakness of extremities has been noted. No change in speech as been reported. He's been taking aspirin 81 mg per day.  LSN: Unclear. tPA Given: No: Unclear when last known well mRankin:  Past Medical History  Diagnosis Date  . Diabetes mellitus without complication ([place])   . Hypertension   . Hyperlipidemia   . CHF (congestive heart failure) ([place])   . Aortic valve stenosis   . A-fib ([place])   . Dementia     History reviewed. No pertinent past surgical history.  Family history: Unavailable due to patient's mental status changes.  Social History:  reports that he has quit smoking. He does not have any smokeless tobacco history on file. He reports that he does not drink alcohol or use illicit drugs.  Allergies: No Known Allergies  Medications: Preadmission medications were reviewed by me.  ROS: Unavailable due to patient's mental status.  Physical Examination: Blood pressure 117/70, pulse 55, temperature 98.7 F (37.1 C), temperature source Rectal, resp. rate 17, SpO2 99 %.  HEENT-  Normocephalic, no lesions, without obvious abnormality.  Normal external eye and conjunctiva.  Normal TM's bilaterally.  Normal auditory canals and external ears. Normal external nose, mucus membranes and septum.  Normal pharynx. Neck supple with no masses, nodes, nodules or enlargement. Cardiovascular - regular rate and rhythm, S1, S2 normal, no murmur,  click, rub or gallop Lungs - chest clear, no wheezing, rales, normal symmetric air entry Abdomen - soft, non-tender; bowel sounds normal; no masses,  no organomegaly Extremities - no joint deformities, effusion, or inflammation  Neurologic Examination: Mental Status: Drowsy, disoriented to current age and marked, no acute distress.  Speech fluent without evidence of aphasia. Able to follow commands without difficulty. Cranial Nerves: II-severe loss of vision bilaterally, including inability to perceive light. III/IV/VI-Pupils were equal and reacted normally to light. Extraocular movements were full and conjugate.    V/VII-no facial numbness and no facial weakness. VIII-normal. X-normal speech and symmetrical palatal movement. XI: trapezius strength/neck flexion strength normal bilaterally XII-midline tongue extension with normal strength. Motor: 5/5 bilaterally with normal tone and bulk Sensory: Normal throughout. Deep Tendon Reflexes: Absent throughout. Plantars: Mute bilaterally Cerebellar: Normal coordination of upper extremities.  Results for orders placed or performed during the hospital encounter of 06/07/15 (from the past 48 hour(s))  Comprehensive metabolic panel     Status: Abnormal   Collection Time: 06/07/15  7:30 PM  Result Value Ref Range   Sodium 135 135 - 145 mmol/L   Potassium 5.5 (H) 3.5 - 5.1 mmol/L   Chloride 105 101 - 111 mmol/L   CO2 25 22 - 32 mmol/L   Glucose, Bld 112 (H) 65 - 99 mg/dL   BUN 29 (H) 6 - 20 mg/dL   Creatinine, Ser 1.52 (H) 0.61 - 1.24 mg/dL   Calcium 9.8 8.9 - 10.3 mg/dL   Total Protein 6.6 6.5 - 8.1 g/dL   Albumin 3.0 (L) 3.5 - 5.0 g/dL   AST 38 15 - 41 U/L  ALT 18 17 - 63 U/L   Alkaline Phosphatase 63 38 - 126 U/L   Total Bilirubin 3.0 (H) 0.3 - 1.2 mg/dL   GFR calc non Af Amer 39 (L) >60 mL/min   GFR calc Af Amer 46 (L) >60 mL/min    Comment: (NOTE) The eGFR has been calculated using the CKD EPI equation. This calculation has not  been validated in all clinical situations. eGFR's persistently <60 mL/min signify possible Chronic Kidney Disease.    Anion gap 5 5 - 15  CBC WITH DIFFERENTIAL     Status: Abnormal   Collection Time: 06/07/15  7:30 PM  Result Value Ref Range   WBC 13.5 (H) 4.0 - 10.5 K/uL   RBC 4.57 4.22 - 5.81 MIL/uL   Hemoglobin 12.7 (L) 13.0 - 17.0 g/dL   HCT 39.0 39.0 - 52.0 %   MCV 85.3 78.0 - 100.0 fL   MCH 27.8 26.0 - 34.0 pg   MCHC 32.6 30.0 - 36.0 g/dL   RDW 13.6 11.5 - 15.5 %   Platelets 317 150 - 400 K/uL   Neutrophils Relative % 73 %   Neutro Abs 9.9 (H) 1.7 - 7.7 K/uL   Lymphocytes Relative 17 %   Lymphs Abs 2.2 0.7 - 4.0 K/uL   Monocytes Relative 8 %   Monocytes Absolute 1.0 0.1 - 1.0 K/uL   Eosinophils Relative 2 %   Eosinophils Absolute 0.3 0.0 - 0.7 K/uL   Basophils Relative 0 %   Basophils Absolute 0.0 0.0 - 0.1 K/uL  Urinalysis, Routine w reflex microscopic (not at [facility])     Status: Abnormal   Collection Time: 06/07/15  7:30 PM  Result Value Ref Range   Color, Urine YELLOW YELLOW   APPearance CLEAR CLEAR   Specific Gravity, Urine 1.019 1.005 - 1.030   pH 5.0 5.0 - 8.0   Glucose, UA NEGATIVE NEGATIVE mg/dL   Hgb urine dipstick NEGATIVE NEGATIVE   Bilirubin Urine NEGATIVE NEGATIVE   Ketones, ur NEGATIVE NEGATIVE mg/dL   Protein, ur NEGATIVE NEGATIVE mg/dL   Nitrite NEGATIVE NEGATIVE   Leukocytes, UA LARGE (A) NEGATIVE  Urine microscopic-add on     Status: Abnormal   Collection Time: 06/07/15  7:30 PM  Result Value Ref Range   Squamous Epithelial / LPF 0-5 (A) NONE SEEN   WBC, UA 6-30 0 - 5 WBC/hpf   RBC / HPF 0-5 0 - 5 RBC/hpf   Bacteria, UA FEW (A) NONE SEEN   Casts HYALINE CASTS (A) NEGATIVE  I-Stat CG4 Lactic Acid, ED  (not at [facility])     Status: None   Collection Time: 06/07/15  7:50 PM  Result Value Ref Range   Lactic Acid, Venous 1.72 0.5 - 2.0 mmol/L   Ct Head Wo Contrast  06/07/2015  CLINICAL DATA:  Increasing lethargy for 12 hours. Fall today. Recent  urinary tract infection. EXAM: CT HEAD WITHOUT CONTRAST TECHNIQUE: Contiguous axial images were obtained from the base of the skull through the vertex without intravenous contrast. COMPARISON:  None. FINDINGS: Brain: There is no evidence of acute intracranial hemorrhage, mass lesion or acute extra-axial fluid collection. There is a possible arachnoid cyst in the left middle cranial fossa. The ventricles and subarachnoid spaces are diffusely prominent, consistent with atrophy. There is ill-defined low-density in the left parietal occipital lobes suspicious for a subacute stroke in the left PCA distribution. There are mild chronic small vessel ischemic changes in the periventricular white matter. There is no hydrocephalus. Intracranial vascular  calcifications are present. Bones/sinuses/visualized face: There is mucosal thickening in the maxillary and ethmoid sinuses bilaterally with a possible right maxillary sinus air-fluid level. The visualized paranasal sinuses, mastoid air cells and middle ears are otherwise clear. The calvarium is intact. IMPRESSION: 1. Ill-defined low-density in the left parietal occipital lobe suspicious for a subacute nonhemorrhagic stroke in the PCA distribution. 2. Atrophy and mild chronic small vessel ischemic changes. 3. Paranasal sinus mucosal thickening with possible right maxillary sinus air-fluid level. Electronically Signed   By: Richardean Sale M.D.   On: 06/07/2015 20:49   Dg Chest Port 1 View  06/07/2015  CLINICAL DATA:  Acute mental status change EXAM: PORTABLE CHEST 1 VIEW COMPARISON:  None. FINDINGS: The heart size and mediastinal contours are within normal limits. Both lungs are clear. The visualized skeletal structures are unremarkable. IMPRESSION: No active disease. Electronically Signed   By: Dorise Bullion III M.D   On: 06/07/2015 21:27    Assessment: 82 y.o. male with multiple risk factors for stroke presenting with altered mental status as well as severe loss of  vision bilaterally. CT scan shows a left PCA territory subacute to acute ischemic infarction. Bilateral PCA territory strokes cannot be ruled out at this point.  Stroke Risk Factors - atrial fibrillation, diabetes mellitus, hyperlipidemia and hypertension  Plan: 1. HgbA1c, fasting lipid panel 2. MRI, MRA  of the brain without contrast 3. PT consult, OT consult, Speech consult 4. Echocardiogram 5. Carotid dopplers 6. Prophylactic therapy-Antiplatelet med: Plavix  7. Risk factor modification 8. Telemetry monitoring  C.R. Nicole Kindred, MD [facility] [phone]  06/07/2015, 10:15 PM

## 2015-06-07 NOTE — ED Notes (Signed)
Via EMS from nursing home. Facility stated he has become increasingly lethargic in last 12 hours. Stated to EMS that he ambulates with help to bathroom and had assisted fall today and hit top of head on counter. Patient normally disoriented x2. Currently being treated with amox for UTI. BP 128/66, HR 47, 98 on 2 L. CBG 118. Patient arrives to ED oriented to self. Lethargic but arrousable to stimuli. Follows commands well but very drowsy. HR 49, BP 144/57, 100 2 L. 0/10 pain scale.

## 2015-06-08 ENCOUNTER — Inpatient Hospital Stay (HOSPITAL_COMMUNITY): Payer: Medicare Other

## 2015-06-08 DIAGNOSIS — I10 Essential (primary) hypertension: Secondary | ICD-10-CM

## 2015-06-08 DIAGNOSIS — Z794 Long term (current) use of insulin: Secondary | ICD-10-CM

## 2015-06-08 DIAGNOSIS — I482 Chronic atrial fibrillation: Secondary | ICD-10-CM

## 2015-06-08 DIAGNOSIS — E1159 Type 2 diabetes mellitus with other circulatory complications: Secondary | ICD-10-CM

## 2015-06-08 DIAGNOSIS — I63433 Cerebral infarction due to embolism of bilateral posterior cerebral arteries: Secondary | ICD-10-CM

## 2015-06-08 DIAGNOSIS — I639 Cerebral infarction, unspecified: Secondary | ICD-10-CM

## 2015-06-08 DIAGNOSIS — I480 Paroxysmal atrial fibrillation: Secondary | ICD-10-CM

## 2015-06-08 DIAGNOSIS — R001 Bradycardia, unspecified: Secondary | ICD-10-CM

## 2015-06-08 DIAGNOSIS — E1165 Type 2 diabetes mellitus with hyperglycemia: Secondary | ICD-10-CM

## 2015-06-08 DIAGNOSIS — F0391 Unspecified dementia with behavioral disturbance: Secondary | ICD-10-CM

## 2015-06-08 DIAGNOSIS — I63439 Cerebral infarction due to embolism of unspecified posterior cerebral artery: Secondary | ICD-10-CM

## 2015-06-08 LAB — COMPREHENSIVE METABOLIC PANEL
ALT: 14 U/L — ABNORMAL LOW (ref 17–63)
AST: 25 U/L (ref 15–41)
Albumin: 2.8 g/dL — ABNORMAL LOW (ref 3.5–5.0)
Alkaline Phosphatase: 57 U/L (ref 38–126)
Anion gap: 10 (ref 5–15)
BUN: 22 mg/dL — AB (ref 6–20)
CHLORIDE: 108 mmol/L (ref 101–111)
CO2: 20 mmol/L — ABNORMAL LOW (ref 22–32)
Calcium: 8.9 mg/dL (ref 8.9–10.3)
Creatinine, Ser: 1.24 mg/dL (ref 0.61–1.24)
GFR calc Af Amer: 58 mL/min — ABNORMAL LOW (ref >60)
GFR, EST NON AFRICAN AMERICAN: 50 mL/min — AB (ref >60)
Glucose, Bld: 75 mg/dL (ref 65–99)
POTASSIUM: 4.4 mmol/L (ref 3.5–5.1)
SODIUM: 138 mmol/L (ref 135–145)
Total Bilirubin: 1.8 mg/dL — ABNORMAL HIGH (ref 0.3–1.2)
Total Protein: 6 g/dL — ABNORMAL LOW (ref 6.5–8.1)

## 2015-06-08 LAB — URINE CULTURE
Culture: <10000 — AB
SPECIAL REQUESTS: NORMAL

## 2015-06-08 LAB — LIPID PANEL
CHOL/HDL RATIO: 3.7 ratio
CHOLESTEROL: 71 mg/dL (ref 0–200)
HDL: 19 mg/dL — AB (ref >40)
LDL Cholesterol: 29 mg/dL (ref 0–99)
TRIGLYCERIDES: 113 mg/dL (ref <150)
VLDL: 23 mg/dL (ref 0–40)

## 2015-06-08 LAB — GLUCOSE, CAPILLARY
GLUCOSE-CAPILLARY: 135 mg/dL — AB (ref 65–99)
GLUCOSE-CAPILLARY: 216 mg/dL — AB (ref 65–99)
Glucose-Capillary: 75 mg/dL (ref 65–99)
Glucose-Capillary: 59 mg/dL — ABNORMAL LOW (ref 65–99)
Glucose-Capillary: 73 mg/dL (ref 65–99)

## 2015-06-08 LAB — BASIC METABOLIC PANEL
ANION GAP: 4 — AB (ref 5–15)
BUN: 25 mg/dL — AB (ref 6–20)
CHLORIDE: 108 mmol/L (ref 101–111)
CO2: 25 mmol/L (ref 22–32)
Calcium: 9.5 mg/dL (ref 8.9–10.3)
Creatinine, Ser: 1.39 mg/dL — ABNORMAL HIGH (ref 0.61–1.24)
GFR calc non Af Amer: 44 mL/min — ABNORMAL LOW (ref >60)
GFR, EST AFRICAN AMERICAN: 51 mL/min — AB (ref >60)
Glucose, Bld: 97 mg/dL (ref 65–99)
POTASSIUM: 4.4 mmol/L (ref 3.5–5.1)
SODIUM: 137 mmol/L (ref 135–145)

## 2015-06-08 LAB — VAS US CAROTID
LCCADDIAS: -16 cm/s
LCCADSYS: -99 cm/s
LCCAPDIAS: 15 cm/s
LEFT VERTEBRAL DIAS: -13 cm/s
LICADDIAS: -20 cm/s
LICADSYS: -69 cm/s
LICAPDIAS: -16 cm/s
LICAPSYS: -71 cm/s
Left CCA prox sys: 84 cm/s
RCCAPSYS: 69 cm/s
RIGHT VERTEBRAL DIAS: -16 cm/s
Right CCA prox dias: 16 cm/s
Right cca dist sys: -94 cm/s

## 2015-06-08 MED ORDER — ATORVASTATIN CALCIUM 10 MG PO TABS
10.0000 mg | ORAL_TABLET | Freq: Every day | ORAL | Status: DC
  Administered 2015-06-09 – 2015-06-10 (×2): 10 mg via ORAL
  Filled 2015-06-08 (×2): qty 1

## 2015-06-08 NOTE — Progress Notes (Signed)
STROKE TEAM PROGRESS NOTE   HISTORY OF PRESENT ILLNESS (per record) Randoll Bir is an 82 y.o. male with a history of diabetes mellitus, hypertension, hyperlipidemia, atrial fibrillation and dementia, brought to the emergency room for evaluation of altered mental status with increasing lethargy for about 12 hours. Patient was afebrile. He is currently being treated for urinary tract infection. CT scan of his head showed low density area involving the left medial parietal and occipital areas, indicative of subacute PCA territory stroke. No weakness of extremities has been noted. No change in speech as been reported. He's been taking aspirin 81 mg per day. His last known well is unclear. Patient was not administered IV t-PA secondary to unclear last known well. He was admitted for further evaluation and treatment.   SUBJECTIVE (INTERVAL HISTORY) No family at bedside. Pt is demented and confused. Poor visual perception. MRI showed bilateral PCA infarct, concerning for embolic infarts. Recommend anticoagulation.    OBJECTIVE Temp:  [98.1 F (36.7 C)-98.7 F (37.1 C)] 98.1 F (36.7 C) (06/05 0600) Pulse Rate:  [49-56] 55 (06/05 0600) Cardiac Rhythm:  [-] Sinus bradycardia (06/05 0808) Resp:  [11-20] 20 (06/05 0600) BP: (117-159)/(55-70) 158/61 mmHg (06/05 0600) SpO2:  [93 %-100 %] 93 % (06/05 0600) Weight:  [90.3 kg (199 lb 1.2 oz)] 90.3 kg (199 lb 1.2 oz) (06/04 2329)  CBC:  Recent Labs Lab 06/07/15 1930  WBC 13.5*  NEUTROABS 9.9*  HGB 12.7*  HCT 39.0  MCV 85.3  PLT [id number]    Basic Metabolic Panel:  Recent Labs Lab 06/07/15 2332 06/08/15 0510  NA 137 138  K 4.4 4.4  CL 108 108  CO2 25 20*  GLUCOSE 97 75  BUN 25* 22*  CREATININE 1.39* 1.24  CALCIUM 9.5 8.9    Lipid Panel:    Component Value Date/Time   CHOL 71 06/08/2015 0510   TRIG 113 06/08/2015 0510   HDL 19* 06/08/2015 0510   CHOLHDL 3.7 06/08/2015 0510   VLDL 23 06/08/2015 0510   LDLCALC 29 06/08/2015 0510    HgbA1c: No results found for: HGBA1C Urine Drug Screen: No results found for: LABOPIA, COCAINSCRNUR, LABBENZ, AMPHETMU, THCU, LABBARB    IMAGING I have personally reviewed the radiological images below and agree with the radiology interpretations.  Ct Head Wo Contrast 06/07/2015  1. Ill-defined low-density in the left parietal occipital lobe suspicious for a subacute nonhemorrhagic stroke in the PCA distribution. 2. Atrophy and mild chronic small vessel ischemic changes. 3. Paranasal sinus mucosal thickening with possible right maxillary sinus air-fluid level.   Dg Chest Port 1 View 06/07/2015  No active disease.   MRI HEAD  06/08/2015  Acute left parietal-occipital lobe nonhemorrhagic infarct. Acute/subacute nonhemorrhagic left temporal - occipital lobe infarct. Acute/subacute small nonhemorrhagic left thalamic infarct. Acute/ subacute right temporal-occipital lobe nonhemorrhagic infarct. Global moderate atrophy without hydrocephalus. Prominent polypoid opacification maxillary sinuses measuring up to 9 mm. Air-fluid level right maxillary sinus suggesting acute sinusitis. Mucosal thickening ethmoid sinus air cells. Partial opacification mastoid air cells.   MRA HEAD  06/08/2015  Moderate to marked tandem stenosis throughout the right posterior cerebral artery. Mild to moderate narrowing proximal left posterior cerebral artery with marked narrowing mid to posterior left posterior cerebral artery. Moderate narrowing superior cerebellar artery bilaterally. Mild narrowing basilar artery without high-grade stenosis. Tiny bulge left lateral aspect probably origin of the left anterior inferior cerebellar artery (which is poorly delineated) rather than aneurysm. Nonvisualized right anterior inferior cerebellar artery. Moderate to marked narrowing of portions  of the posterior inferior cerebellar artery bilaterally. Moderate narrowing right internal carotid artery cavernous segment. Moderate to marked focal  narrowing left internal carotid artery cavernous/ supraclinoid junction. Prominence of the origin of the ophthalmic artery bilaterally. Tiny aneurysms possible although I suspect this may represent ectasia of the origin of the ophthalmic artery rather than aneurysm. Moderate focal stenosis distal M1 segment right middle cerebral artery. Mild to moderate narrowing middle cerebral artery branches greater on the right.   Carotid Doppler   There is 1-39% bilateral ICA stenosis. Vertebral artery flow is antegrade.    TTE - pending    PHYSICAL EXAM General - Well nourished, well developed, in no apparent distress.  Ophthalmologic - Fundi not visualized due to noncooperation.  Cardiovascular - Regular rate and rhythm.  Neuro - awake, alert, orientated to self, but not to place, age, time or people. Follows simple commands, paucity of speech, able to name and repeat. PERRL, EOMI, decreased visual acuity bilaterally, can only see hand waving but not finger counting. Facial symmetrical, tongue in middle. Muscle strength equal symmetrical bilaterally, all 4/5. Sensation symmetrical, finger to nose no ataxia bilaterally, gait deferred.     ASSESSMENT/PLAN Mr. Femi Webb is a 82 y.o. male with history of diabetes mellitus, hypertension, hyperlipidemia, atrial fibrillation not on [facility] and dementia presenting with altered mental status and increased lethargy. He did not receive IV t-PA due to unknown time for Last known well.   Stroke:  Bilateral PCA infarcts, likely embolic secondary to known afib not on AC  Resultant  Decreased visual acuity bilaterally  MRI  B/l PCA infarcts  MRA  B/l PCA stenosis / atherosclerosis  Carotid Doppler  No significant stenosis  2D Echo  pending  LDL 29  HgbA1c pending  SCDs for VTE prophylaxis  Diet Heart Room service appropriate?: Yes; Fluid consistency:: Thin  aspirin 81 mg daily and clopidogrel 75 mg daily prior to admission, now on aspirin 81 mg daily  and clopidogrel 75 mg daily. For afib, we recommend eliquis 5mg  bid for stroke prevention in 5 days due to moderate infarct size.   Patient counseled to be compliant with his antithrombotic medications  Ongoing aggressive stroke risk factor management  Therapy recommendations:  pending   Disposition:  pending (admitted from ALF since 2009 with plans to transfer to SNF in 30 days)  Atrial Fibrillation  Home anticoagulation:  none   We recommended  eliquis 5mg  bid for stroke prevention in 5 days due to moderate infarct size.  Hypertension  Stable  Permissive hypertension (OK if < 220/120) but gradually normalize in 5-7 days  Hyperlipidemia  Home meds:  Lipitor 40, resumed in hospital  LDL 29, at goal < 70  Decrease lipitor from 40 to 10mg  due to low LDL.   Continue statin at discharge  Diabetes type II  HgbA1c pending, goal < 7.0  Other Stroke Risk Factors  Advanced age  Overweight  Body mass index is 29.38 kg/(m^2).   CHF  Aortic valve stenosis  Other Active Problems  Baseline dementia  AKI with hypokalemia  UTI  Diarrhea  Bilirubin of 3, etiology unclear  Bradycardia  Hospital day # 1  Neurology will sign off. Please call with questions. Pt will follow up with Cecille Rubin NP at [facility] in about 2 months. Thanks for the consult.  Rosalin Hawking, MD PhD Stroke Neurology 06/08/2015 10:57 PM     To contact Stroke Continuity provider, please refer to [email or web address] After hours, contact General Neurology

## 2015-06-08 NOTE — Progress Notes (Addendum)
[facility] PROGRESS NOTE  Ronnie Weeks [id number] DOB: [birth year] DOA: 06/07/2015 PCP: No primary care provider on file.  Interim summary and HPI 82 y.o. gentleman with a history of HTN, HLD, IDDM, reported atrial fibrillation (does not appear to be anticoagulated), CHF (type unknown), and dementia (only oriented to self) who is unable to give any meaningful history at this time. This H/P taken from limited EMR and interview with his sister-in-law/POA [name] by phone. This is his first admission to this hospital; he has been admitted to [facility] in the past. Essentially, he has 2-3 weeks of gradual decline, including decreased mobility, decreased appetite, and intermittent noncompliance with taking his oral medications (he sometimes spits them out). He has had recurrent falls. He was been evaluated by outpatient providers at the request of his ALF, and he was recently diagnosed with a UTI and prescribed amoxicillin for it. His family had also been told that he was no longer appropriate for assisted living due to his level of debility and were given 30 days to make arrangements for SNF. Today, he had a fall while the patient care techs were trying to get him up.   Assessment/Plan: Subacute CVA --Neurology consult greatly appreciated --MRI showing multiple acute/subacute infarcts suggesting embolic source  --Patient with a history of atrial fibrillation and not a candidate for anticoagulation (advanced age, dementia, high risk for falls ) --We will continue aspirin and Plavix for secondary prevention --complete stroke protocol --Telemetry monitoring --Continue Lipitor  Hyperlipidemia --Will check lipid panel and continue statins  AKI with hyperkalemia --Hydration with NS --Follow basic metabolic panel to assess electrolytes and renal function trend --Appears to be secondary to dehydration and prerenal azotemia, UTI and continue use of nephrotoxic agents  UTI --Continue IV  Rocephin empirically --Urine culture pending  Diarrhea --Enteric precautions started in ED --Monitor for now; if persistent, consider GI pathogens panel --Patient uses chronically laxatives as part of his medication regimen and was recently started on amoxicillin for UTI --No abdominal pain, no nausea, no vomiting. Will follow clinical response  Bilirubin of 3 of unclear etiology --RUQ ultrasound demonstrating hepatic steatosis --No icterus --We will monitor  Bradycardia --Continue holding Beta blocker now and observe --Heart rate in the 40-55 range --While in no exertion declines any complaints  Atrial fibrillation --CHADsVASC score 4-5 --not a candidate for anticoagulation -rate controlled/to slightly bradycardia; will hold B-blockers -continue ASA and Plavix  DM --We will continue holding Januvia and metformin now --Continue SSI coverage AC/HS and lantus --follow A1C  History of dementia with depression/behavioral issues --Continue aricept and namenda --continue supportive care and continue Celexa/Seroquel  HTN --Continue amlodipine   Code Status:DNR/DNI Family Communication: no family at bedside  Disposition Plan: to be determine; came from ALF, but actively declining and not longer a candidate for ALF giving increase need of assistance and care. Will complete work up and follow rec's from neurology/PT. Might need SNF with palliative care follow up.   Consultants:  Neurology   Procedures: MRI: Acute left parietal-occipital lobe nonhemorrhagic infarct. Acute/subacute nonhemorrhagic left temporal - occipital lobe infarct. Acute/subacute small nonhemorrhagic left thalamic infarct. Acute/ subacute right temporal-occipital lobe nonhemorrhagic  infarct.  2-D echo:pending  Carotid duplex: No evidence of a significant stenosis in bilateral carotid arteries - 1-39%. Vertebral arteries demonstrate antegrade flow.  Antibiotics:  Rocephin  6/4  HPI/Subjective: Afebrile, no CP or SOB. In no acute distress and only oriented X1.  Objective: Filed Vitals:   06/07/15 2329 06/08/15 0600  BP: 159/61 158/61  Pulse: 56 55  Temp: 98.1 F (36.7 C) 98.1 F (36.7 C)  Resp: 18 20    Intake/Output Summary (Last 24 hours) at 06/08/15 1244 Last data filed at 06/07/15 2131  Gross per 24 hour  Intake   1000 ml  Output    100 ml  Net    900 ml   Filed Weights   06/07/15 2329  Weight: 90.3 kg (199 lb 1.2 oz)    Exam:   General:  Afebrile, no CP, no SOB, oriented X1. Underweight on appearance   Cardiovascular: rate controlled, no rubs or gallops   Respiratory: no wheezing, no crackles  Abdomen: soft, NT, ND, positive BS  Musculoskeletal: no cyanosis or clubbing   Neuro: moving 4 limbs, no focal motor deficit; CN grossly intact   Data Reviewed: Basic Metabolic Panel:  Recent Labs Lab 06/07/15 1930 06/07/15 2332 06/08/15 0510  NA 135 137 138  K 5.5* 4.4 4.4  CL 105 108 108  CO2 25 25 20*  GLUCOSE 112* 97 75  BUN 29* 25* 22*  CREATININE 1.52* 1.39* 1.24  CALCIUM 9.8 9.5 8.9   Liver Function Tests:  Recent Labs Lab 06/07/15 1930 06/08/15 0510  AST 38 25  ALT 18 14*  ALKPHOS 63 57  BILITOT 3.0* 1.8*  PROT 6.6 6.0*  ALBUMIN 3.0* 2.8*   CBC:  Recent Labs Lab 06/07/15 1930  WBC 13.5*  NEUTROABS 9.9*  HGB 12.7*  HCT 39.0  MCV 85.3  PLT 317   CBG:  Recent Labs Lab 06/08/15 0648  GLUCAP 75    No results found for this or any previous visit (from the past 240 hour(s)).   Studies: Ct Head Wo Contrast  06/07/2015  CLINICAL DATA:  Increasing lethargy for 12 hours. Fall today. Recent urinary tract infection. EXAM: CT HEAD WITHOUT CONTRAST TECHNIQUE: Contiguous axial images were obtained from the base of the skull through the vertex without intravenous contrast. COMPARISON:  None. FINDINGS: Brain: There is no evidence of acute intracranial hemorrhage, mass lesion or acute extra-axial fluid  collection. There is a possible arachnoid cyst in the left middle cranial fossa. The ventricles and subarachnoid spaces are diffusely prominent, consistent with atrophy. There is ill-defined low-density in the left parietal occipital lobes suspicious for a subacute stroke in the left PCA distribution. There are mild chronic small vessel ischemic changes in the periventricular white matter. There is no hydrocephalus. Intracranial vascular calcifications are present. Bones/sinuses/visualized face: There is mucosal thickening in the maxillary and ethmoid sinuses bilaterally with a possible right maxillary sinus air-fluid level. The visualized paranasal sinuses, mastoid air cells and middle ears are otherwise clear. The calvarium is intact. IMPRESSION: 1. Ill-defined low-density in the left parietal occipital lobe suspicious for a subacute nonhemorrhagic stroke in the PCA distribution. 2. Atrophy and mild chronic small vessel ischemic changes. 3. Paranasal sinus mucosal thickening with possible right maxillary sinus air-fluid level. Electronically Signed   By: Richardean Sale M.D.   On: 06/07/2015 20:49   Mr Brain Wo Contrast  06/08/2015  CLINICAL DATA:  82 year old diabetic hypertensive male with dementia presenting with 2-3 gradual decline. Recurrent falls. Subsequent encounter. EXAM: MRI HEAD WITHOUT CONTRAST MRA HEAD WITHOUT CONTRAST TECHNIQUE: Multiplanar, multiecho pulse sequences of the brain and surrounding structures were obtained without intravenous contrast. Angiographic images of the head were obtained using MRA technique without contrast. COMPARISON:  06/06/2005 CT.  10/02/2007 MR. FINDINGS: MRI HEAD FINDINGS Acute left parietal-occipital lobe nonhemorrhagic infarct. Acute/subacute nonhemorrhagic left temporal - occipital  lobe infarct. Acute/subacute small nonhemorrhagic left thalamic infarct. Acute/ subacute right temporal-occipital lobe nonhemorrhagic infarct. With involvement of the occipital  lobes bilaterally, one could question posterior reversible encephalopathy syndrome however this is felt to be less likely consideration than infarction. Global moderate atrophy without hydrocephalus. No intracranial mass lesion noted on this unenhanced exam. Cervical spondylotic changes with spinal stenosis and slight cord flattening C3-4 level. Cervical medullary junction unremarkable. Prominent polypoid opacification maxillary sinuses measuring up to 9 mm. Air-fluid level right maxillary sinus suggesting acute sinusitis. Mucosal thickening ethmoid sinus air cells. Partial opacification mastoid air cells without obstructing lesion of eustachian tube noted. MRA HEAD FINDINGS Moderate to marked tandem stenosis throughout the right posterior cerebral artery. Mild to moderate narrowing proximal left posterior cerebral artery with marked narrowing mid to posterior left posterior cerebral artery. Moderate narrowing superior cerebellar artery bilaterally. Mild narrowing basilar artery without high-grade stenosis. Tiny bulge left lateral aspect probably origin of the left anterior inferior cerebellar artery (which is poorly delineated) rather than aneurysm. Nonvisualized right anterior inferior cerebellar artery. Mild narrowing distal vertebral arteries bilaterally. Moderate to marked narrowing of portions of the posterior inferior cerebellar artery bilaterally. Moderate narrowing right internal carotid artery cavernous segment. Moderate to marked focal narrowing left internal carotid artery cavernous/ supraclinoid junction. Prominence of the origin of the ophthalmic artery bilaterally. Tiny aneurysms possible although I suspect this may represent ectasia of the origin of the ophthalmic artery rather than aneurysm. Mildly hypoplastic A1 segment right anterior cerebral artery. Moderate focal stenosis distal M1 segment right middle cerebral artery. Mild to moderate narrowing middle cerebral artery branches greater on the  right. IMPRESSION: MRI HEAD Acute left parietal-occipital lobe nonhemorrhagic infarct. Acute/subacute nonhemorrhagic left temporal - occipital lobe infarct. Acute/subacute small nonhemorrhagic left thalamic infarct. Acute/ subacute right temporal-occipital lobe nonhemorrhagic infarct. Global moderate atrophy without hydrocephalus. Prominent polypoid opacification maxillary sinuses measuring up to 9 mm. Air-fluid level right maxillary sinus suggesting acute sinusitis. Mucosal thickening ethmoid sinus air cells. Partial opacification mastoid air cells. MRA HEAD FINDINGS Moderate to marked tandem stenosis throughout the right posterior cerebral artery. Mild to moderate narrowing proximal left posterior cerebral artery with marked narrowing mid to posterior left posterior cerebral artery. Moderate narrowing superior cerebellar artery bilaterally. Mild narrowing basilar artery without high-grade stenosis. Tiny bulge left lateral aspect probably origin of the left anterior inferior cerebellar artery (which is poorly delineated) rather than aneurysm. Nonvisualized right anterior inferior cerebellar artery. Moderate to marked narrowing of portions of the posterior inferior cerebellar artery bilaterally. Moderate narrowing right internal carotid artery cavernous segment. Moderate to marked focal narrowing left internal carotid artery cavernous/ supraclinoid junction. Prominence of the origin of the ophthalmic artery bilaterally. Tiny aneurysms possible although I suspect this may represent ectasia of the origin of the ophthalmic artery rather than aneurysm. Moderate focal stenosis distal M1 segment right middle cerebral artery. Mild to moderate narrowing middle cerebral artery branches greater on the right. Electronically Signed   By: Genia Del M.D.   On: 06/08/2015 07:21   Dg Chest Port 1 View  06/07/2015  CLINICAL DATA:  Acute mental status change EXAM: PORTABLE CHEST 1 VIEW COMPARISON:  None. FINDINGS: The heart  size and mediastinal contours are within normal limits. Both lungs are clear. The visualized skeletal structures are unremarkable. IMPRESSION: No active disease. Electronically Signed   By: Dorise Bullion III M.D   On: 06/07/2015 21:27   Mr Jodene Nam Head/brain Wo Cm  06/08/2015  CLINICAL DATA:  82 year old diabetic hypertensive male with  dementia presenting with 2-3 gradual decline. Recurrent falls. Subsequent encounter. EXAM: MRI HEAD WITHOUT CONTRAST MRA HEAD WITHOUT CONTRAST TECHNIQUE: Multiplanar, multiecho pulse sequences of the brain and surrounding structures were obtained without intravenous contrast. Angiographic images of the head were obtained using MRA technique without contrast. COMPARISON:  06/06/2005 CT.  10/02/2007 MR. FINDINGS: MRI HEAD FINDINGS Acute left parietal-occipital lobe nonhemorrhagic infarct. Acute/subacute nonhemorrhagic left temporal - occipital lobe infarct. Acute/subacute small nonhemorrhagic left thalamic infarct. Acute/ subacute right temporal-occipital lobe nonhemorrhagic infarct. With involvement of the occipital lobes bilaterally, one could question posterior reversible encephalopathy syndrome however this is felt to be less likely consideration than infarction. Global moderate atrophy without hydrocephalus. No intracranial mass lesion noted on this unenhanced exam. Cervical spondylotic changes with spinal stenosis and slight cord flattening C3-4 level. Cervical medullary junction unremarkable. Prominent polypoid opacification maxillary sinuses measuring up to 9 mm. Air-fluid level right maxillary sinus suggesting acute sinusitis. Mucosal thickening ethmoid sinus air cells. Partial opacification mastoid air cells without obstructing lesion of eustachian tube noted. MRA HEAD FINDINGS Moderate to marked tandem stenosis throughout the right posterior cerebral artery. Mild to moderate narrowing proximal left posterior cerebral artery with marked narrowing mid to posterior  left posterior cerebral artery. Moderate narrowing superior cerebellar artery bilaterally. Mild narrowing basilar artery without high-grade stenosis. Tiny bulge left lateral aspect probably origin of the left anterior inferior cerebellar artery (which is poorly delineated) rather than aneurysm. Nonvisualized right anterior inferior cerebellar artery. Mild narrowing distal vertebral arteries bilaterally. Moderate to marked narrowing of portions of the posterior inferior cerebellar artery bilaterally. Moderate narrowing right internal carotid artery cavernous segment. Moderate to marked focal narrowing left internal carotid artery cavernous/ supraclinoid junction. Prominence of the origin of the ophthalmic artery bilaterally. Tiny aneurysms possible although I suspect this may represent ectasia of the origin of the ophthalmic artery rather than aneurysm. Mildly hypoplastic A1 segment right anterior cerebral artery. Moderate focal stenosis distal M1 segment right middle cerebral artery. Mild to moderate narrowing middle cerebral artery branches greater on the right. IMPRESSION: MRI HEAD Acute left parietal-occipital lobe nonhemorrhagic infarct. Acute/subacute nonhemorrhagic left temporal - occipital lobe infarct. Acute/subacute small nonhemorrhagic left thalamic infarct. Acute/ subacute right temporal-occipital lobe nonhemorrhagic infarct. Global moderate atrophy without hydrocephalus. Prominent polypoid opacification maxillary sinuses measuring up to 9 mm. Air-fluid level right maxillary sinus suggesting acute sinusitis. Mucosal thickening ethmoid sinus air cells. Partial opacification mastoid air cells. MRA HEAD FINDINGS Moderate to marked tandem stenosis throughout the right posterior cerebral artery. Mild to moderate narrowing proximal left posterior cerebral artery with marked narrowing mid to posterior left posterior cerebral artery. Moderate narrowing superior cerebellar artery bilaterally. Mild narrowing  basilar artery without high-grade stenosis. Tiny bulge left lateral aspect probably origin of the left anterior inferior cerebellar artery (which is poorly delineated) rather than aneurysm. Nonvisualized right anterior inferior cerebellar artery. Moderate to marked narrowing of portions of the posterior inferior cerebellar artery bilaterally. Moderate narrowing right internal carotid artery cavernous segment. Moderate to marked focal narrowing left internal carotid artery cavernous/ supraclinoid junction. Prominence of the origin of the ophthalmic artery bilaterally. Tiny aneurysms possible although I suspect this may represent ectasia of the origin of the ophthalmic artery rather than aneurysm. Moderate focal stenosis distal M1 segment right middle cerebral artery. Mild to moderate narrowing middle cerebral artery branches greater on the right. Electronically Signed   By: Genia Del M.D.   On: 06/08/2015 07:21   US Abdomen Limited Ruq  06/08/2015  CLINICAL DATA:  Abnormal liver function tests.  Cholecystectomy. EXAM: US ABDOMEN LIMITED - RIGHT UPPER QUADRANT COMPARISON:  CT 05/23/2015 FINDINGS: Gallbladder: Surgically absent Common bile duct: Diameter: Upper limits of normal at 6 mm. Liver: Liver is diffusely increased in echogenicity. No focal lesion or duct dilatation. IMPRESSION: Increased liver echogenicity commonly represents hepatic steatosis. Electronically Signed   By: Suzy Bouchard M.D.   On: 06/08/2015 09:33    Scheduled Meds: . amLODipine  5 mg Oral Daily  . aspirin  162 mg Oral Daily  . atorvastatin  40 mg Oral Daily  . cefTRIAXone (ROCEPHIN)  IV  1 g Intravenous Q24H  . citalopram  20 mg Oral Daily  . clopidogrel  75 mg Oral Daily  . donepezil  10 mg Oral QHS  . insulin aspart  0-9 Units Subcutaneous TID WC  . insulin glargine  10 Units Subcutaneous QHS  . memantine  28 mg Oral Daily  . QUEtiapine  25 mg Oral QHS   Continuous Infusions:   Principal Problem:   Stroke  [facility]) Active Problems:   UTI (lower urinary tract infection)   Leukocytosis   AKI (acute kidney injury) ([place])   Abnormal bilirubin test   Hyperkalemia   Diarrhea   Bradycardia   Dementia   HTN (hypertension)   Diabetes (HCC)   Atrial fibrillation (HCC)   CHF (congestive heart failure) (HCC)   CVA (cerebral infarction)    Time spent: 30 minutes    Barton Dubois  [facility] Pager [phone]. If 7PM-7AM, please contact night-coverage at [email or web address], password [facility] 06/08/2015, 12:44 PM  LOS: 1 day

## 2015-06-08 NOTE — Care Management Note (Signed)
Case Management Note  Patient Details  Name: Ronnie Weeks MRN: [id number] Date of Birth: Dec 26, 1926  Subjective/Objective:    Pt admitted with CVA from [facility].                Action/Plan: PT rec is for SNF. CM following for discharge disposition.   Expected Discharge Date:                  Expected Discharge Plan:  [place]  In-House Referral:     Discharge planning Services     Post Acute Care Choice:    Choice offered to:     DME Arranged:    DME Agency:     HH Arranged:    [place] Agency:     Status of Service:  In process, will continue to follow  Medicare Important Message Given:    Date Medicare IM Given:    Medicare IM give by:    Date Additional Medicare IM Given:    Additional Medicare Important Message give by:     If discussed at [place] of Stay Meetings, dates discussed:    Additional Comments:  Pollie Friar, RN 06/08/2015, 1:39 PM

## 2015-06-08 NOTE — Progress Notes (Signed)
NO LOOSE STOOLS TODAY

## 2015-06-08 NOTE — NC FL2 (Signed)
[place] MEDICAID FL2 LEVEL OF CARE SCREENING TOOL     IDENTIFICATION  Patient Name: Ronnie Weeks Birthdate: 12/13/1926 Sex: male Admission Date (Current Location): 06/07/2015  [facility] and [place] Number:  Herbalist and Address:  [facility], [place] [address], [place], [place] [address]      Provider Number: [id number]  Attending Physician Name and Address:  Barton Dubois, MD  Relative Name and Phone Number:  Inez Catalina, sister, [phone]    Current Level of Care: Hospital Recommended Level of Care: [place] Prior Approval Number:    Date Approved/Denied:   PASRR Number: [id number]  Discharge Plan: SNF    Current Diagnoses: Patient Active Problem List   Diagnosis Date Noted  . Stroke ([place]) 06/07/2015  . UTI (lower urinary tract infection) 06/07/2015  . Leukocytosis 06/07/2015  . AKI (acute kidney injury) ([place]) 06/07/2015  . Abnormal bilirubin test 06/07/2015  . Hyperkalemia 06/07/2015  . Diarrhea 06/07/2015  . Bradycardia 06/07/2015  . Dementia 06/07/2015  . HTN (hypertension) 06/07/2015  . Diabetes ([place]) 06/07/2015  . Atrial fibrillation ([place]) 06/07/2015  . CHF (congestive heart failure) ([place]) 06/07/2015  . CVA (cerebral infarction) 06/07/2015    Orientation RESPIRATION BLADDER Height & Weight     Self  Normal Continent Weight: 90.3 kg (199 lb 1.2 oz) Height:  5\' 9"  (175.3 cm)  BEHAVIORAL SYMPTOMS/MOOD NEUROLOGICAL BOWEL NUTRITION STATUS      Continent Diet (Please see DC Summary)  AMBULATORY STATUS COMMUNICATION OF NEEDS Skin   Extensive Assist Verbally Normal                       Personal Care Assistance Level of Assistance  Bathing, Feeding, Dressing Bathing Assistance: Maximum assistance Feeding assistance: Independent Dressing Assistance: Maximum assistance     Functional Limitations Info             SPECIAL CARE FACTORS FREQUENCY  PT (By licensed PT)     PT Frequency: min  2x/week              Contractures      Additional Factors Info  Code Status, Allergies, Isolation Precautions, Insulin Sliding Scale, Psychotropic Code Status Info: DNR Allergies Info: NKA Psychotropic Info: Seroquel Insulin Sliding Scale Info: insulin aspart (novoLOG) injection 0-9 Units;insulin glargine (LANTUS) injection 10 Units Isolation Precautions Info: Enteric precautions     Current Medications (06/08/2015):  This is the current hospital active medication list Current Facility-Administered Medications  Medication Dose Route Frequency Provider Last Rate Last Dose  . acetaminophen (TYLENOL) tablet 650 mg  650 mg Oral Q4H PRN Lily Kocher, MD       Or  . acetaminophen (TYLENOL) suppository 650 mg  650 mg Rectal Q4H PRN Lily Kocher, MD      . amLODipine (NORVASC) tablet 5 mg  5 mg Oral Daily Lily Kocher, MD   5 mg at 06/08/15 1041  . aspirin chewable tablet 162 mg  162 mg Oral Daily Lily Kocher, MD   162 mg at 06/08/15 1041  . atorvastatin (LIPITOR) tablet 40 mg  40 mg Oral Daily Lily Kocher, MD   40 mg at 06/08/15 1041  . cefTRIAXone (ROCEPHIN) 1 g in dextrose 5 % 50 mL IVPB  1 g Intravenous Q24H Laren Everts, RPH   1 g at 06/08/15 0024  . citalopram (CELEXA) tablet 20 mg  20 mg Oral Daily Lily Kocher, MD   20 mg at 06/08/15 1041  . clopidogrel (  PLAVIX) tablet 75 mg  75 mg Oral Daily Lily Kocher, MD   75 mg at 06/08/15 1042  . donepezil (ARICEPT) tablet 10 mg  10 mg Oral QHS Lily Kocher, MD   10 mg at 06/08/15 0023  . insulin aspart (novoLOG) injection 0-9 Units  0-9 Units Subcutaneous TID WC Lily Kocher, MD   0 Units at 06/08/15 0729  . insulin glargine (LANTUS) injection 10 Units  10 Units Subcutaneous QHS Lily Kocher, MD   10 Units at 06/08/15 0045  . memantine (NAMENDA XR) 24 hr capsule 28 mg  28 mg Oral Daily Lily Kocher, MD   28 mg at 06/08/15 1042  . QUEtiapine (SEROQUEL) tablet 25 mg  25 mg Oral QHS Lily Kocher, MD   25 mg at 06/08/15 0023     Discharge  Medications: Please see discharge summary for a list of discharge medications.  Relevant Imaging Results:  Relevant Lab Results:   Additional Information SSN: Becker Berk[place], [place]

## 2015-06-08 NOTE — Evaluation (Signed)
Physical Therapy Evaluation Patient Details Name: Tsugio Glen MRN: [id number] DOB: Dec 20, 1926 Today's Date: 06/08/2015   History of Present Illness  Patient is a 82 y/o male with hx of DM, HTN, HLD, CHF, dementia and A-fib presents with 2-3 weeks of gradual decline, including decreased mobility, decreased appetite, falls and intermittent noncompliance with taking his oral medications. CT head-subacute CVA in the left PCA distribution. MRI-Acute left parietal-occipital lobe nonhemorrhagic infarct.  Clinical Impression  Patient presents with decreased cognition, difficulty following commands, visual deficits, generalized weakness and impaired balance impacting mobility. Tolerated taking a few steps with min A for balance/safety. Requires max directional cues due to visual deficits. Difficult to assess vision due to hx of dementia/cognition. Per MD notes, pt not able to return back to ALF due to gradual decline. Would benefit from [facility] SNF to maximize independence, mobility and decrease burden of care. Will follow.    Follow Up Recommendations SNF;Supervision/Assistance - 24 hour    Equipment Recommendations  None recommended by PT    Recommendations for Other Services OT consult     Precautions / Restrictions Precautions Precautions: Fall Precaution Comments: visual deficits- not sure pt can see anything Restrictions Weight Bearing Restrictions: No      Mobility  Bed Mobility Overal bed mobility: Needs Assistance Bed Mobility: Supine to Sit     Supine to sit: Min guard;HOB elevated     General bed mobility comments: No assist needed. Use of rail.  Transfers Overall transfer level: Needs assistance Equipment used: Rolling walker (2 wheeled) Transfers: Sit to/from Stand Sit to Stand: Min assist         General transfer comment: Min A to boost from EOB x2 with cues for hand placement.   Ambulation/Gait Ambulation/Gait assistance: Min assist Ambulation Distance (Feet): 6  Feet Assistive device: Rolling walker (2 wheeled) Gait Pattern/deviations: Step-through pattern;Decreased stride length Gait velocity: decreased   General Gait Details: Able to take a few steps forward and backwards with max cueing due to visual deficits. Difficulty following instructional cues. Min A for balance/cues.  Stairs            Wheelchair Mobility    Modified Rankin (Stroke Patients Only) Modified Rankin (Stroke Patients Only) Pre-Morbid Rankin Score: Moderately severe disability (not sure of PLOF) Modified Rankin: Moderately severe disability     Balance Overall balance assessment: Needs assistance;History of Falls Sitting-balance support: Feet supported;No upper extremity supported Sitting balance-Leahy Scale: Fair     Standing balance support: During functional activity Standing balance-Leahy Scale: Poor Standing balance comment: Reliant on BUEs for support. max directional cues due to visual deficits.                             Pertinent Vitals/Pain Pain Assessment: Faces Faces Pain Scale: No hurt    Home Living Family/patient expects to be discharged to:: Skilled nursing facility                      Prior Function Level of Independence: Needs assistance   Gait / Transfers Assistance Needed: Reports having assist with walking  ADL's / Homemaking Assistance Needed: Assuming assist with ADLs at ALF.  Comments: Pt not able to provide PLOF/history due to hx of dementia. No family members present. Per MD notes, pt has had a gradual decline over the last 3-4 weeks with falls and ALF not able to care for him anymore.     Hand Dominance  Extremity/Trunk Assessment   Upper Extremity Assessment: Defer to OT evaluation           Lower Extremity Assessment: Generalized weakness (trembling BLEs in standing.)         Communication   Communication: No difficulties  Cognition Arousal/Alertness: Awake/alert Behavior  During Therapy: Flat affect Overall Cognitive Status:  (difficulty following simple 1-2 step commands.)                      General Comments General comments (skin integrity, edema, etc.): Not able to     Exercises        Assessment/Plan    PT Assessment Patient needs continued PT services  PT Diagnosis Difficulty walking;Altered mental status;Generalized weakness   PT Problem List Decreased strength;Decreased cognition;Decreased balance;Decreased mobility;Decreased activity tolerance;Decreased safety awareness;Decreased knowledge of use of DME  PT Treatment Interventions Balance training;Gait training;Functional mobility training;Therapeutic activities;Therapeutic exercise;Patient/family education;DME instruction;Neuromuscular re-education   PT Goals (Current goals can be found in the Care Plan section) Acute Rehab PT Goals Patient Stated Goal: none stated PT Goal Formulation: Patient unable to participate in goal setting Time For Goal Achievement: 06/22/15 Potential to Achieve Goals: Fair    Frequency Min 2X/week   Barriers to discharge Decreased caregiver support      Co-evaluation               End of Session Equipment Utilized During Treatment: Gait belt Activity Tolerance: Patient tolerated treatment well Patient left: in chair;with call bell/phone within reach;with chair alarm set Nurse Communication: Mobility status         Time: 1125-1145 PT Time Calculation (min) (ACUTE ONLY): 20 min   Charges:   PT Evaluation $PT Eval Moderate Complexity: 1 Procedure     PT G Codes:        Kahlyn Shippey A Breckin Zafar 06/08/2015, 1:21 PM Wray Kearns, [place], DPT [phone]

## 2015-06-08 NOTE — Progress Notes (Signed)
Preliminary results by tech - Carotid Duplex Completed. No evidence of a significant stenosis in bilateral carotid arteries - 1-39%. Vertebral arteries demonstrate antegrade flow. Oda Cogan, BS, RDMS, RVT

## 2015-06-08 NOTE — Clinical Social Work Note (Signed)
Clinical Social Work Assessment  Patient Details  Name: Ronnie Weeks MRN: [id number] Date of Birth: May 29, 1926  Date of referral:  06/08/15               Reason for consult:  Facility Placement                Permission sought to share information with:  Facility Sport and exercise psychologist, Family Supports Permission granted to share information::  No (Patient is disoriented; completed assessment at bedside with sister (POA))  Name::     Ronnie Weeks  Agency::  [facility]  Relationship::  Sister  Contact Information:  [phone]  Housing/Transportation Living arrangements for the past 2 months:  [place] of Information:  Power of [place], Other (Comment Required) (Sister and nephew) Patient Interpreter Needed:  None Criminal Activity/Legal Involvement Pertinent to Current Situation/Hospitalization:  No - Comment as needed Significant Relationships:  Other Family Members, Siblings Lives with:  Self Do you feel safe going back to the place where you live?  No Need for family participation in patient care:  Yes (Comment)  Care giving concerns:  CSW received referral for possible SNF placement at time of discharge. CSW met with patient and patient's sister and nephew at bedside regarding PT recommendation of SNF placement at time of discharge. Patient is disoriented. Per patient's sister, patient is from [facility] and is unable to return there at his current level of care needs Patient's sister and nephew expressed understanding of PT recommendation and are agreeable to SNF placement at time of discharge. CSW to continue to follow and assist with discharge planning needs.   Social Worker assessment / plan:  CSW spoke with patient's sister and nephew concerning possibility of rehab at [facility] before returning home. Sister would like patient's nephew(Ronnie Weeks) to be contacted regarding discharge decisions.   Employment status:  Retired Forensic scientist:   Medicare PT Recommendations:  [place] / Referral to community resources:  [place]  Patient/Family's Response to care:  Patient's family recognizes need for rehab before returning home and are agreeable to a SNF. Patient reported preference for [facility] or [facility].  Patient/Family's Understanding of and Emotional Response to Diagnosis, Current Treatment, and Prognosis:  Patient is realistic regarding therapy needs. No questions/concerns about plan or treatment.    Emotional Assessment Appearance:  Appears stated age Attitude/Demeanor/Rapport:  Unable to Assess Affect (typically observed):  Unable to Assess Orientation:  Oriented to Self Alcohol / Substance use:  Not Applicable Psych involvement (Current and /or in the community):  No (Comment)  Discharge Needs  Concerns to be addressed:  Care Coordination Readmission within the last 30 days:  No Current discharge risk:  None Barriers to Discharge:  Continued Medical Work up   [employer], [place] 06/08/2015, 5:31 PM

## 2015-06-09 ENCOUNTER — Inpatient Hospital Stay (HOSPITAL_COMMUNITY): Payer: Medicare Other

## 2015-06-09 DIAGNOSIS — R197 Diarrhea, unspecified: Secondary | ICD-10-CM

## 2015-06-09 DIAGNOSIS — I6789 Other cerebrovascular disease: Secondary | ICD-10-CM

## 2015-06-09 LAB — HEMOGLOBIN A1C
HEMOGLOBIN A1C: 9.4 % — AB (ref 4.8–5.6)
Mean Plasma Glucose: 223 mg/dL

## 2015-06-09 LAB — GLUCOSE, CAPILLARY
GLUCOSE-CAPILLARY: 137 mg/dL — AB (ref 65–99)
Glucose-Capillary: 249 mg/dL — ABNORMAL HIGH (ref 65–99)
Glucose-Capillary: 182 mg/dL — ABNORMAL HIGH (ref 65–99)

## 2015-06-09 LAB — ECHOCARDIOGRAM COMPLETE
Height: 69 in
Weight: 3185.21 oz

## 2015-06-09 MED ORDER — CEFUROXIME AXETIL 500 MG PO TABS
250.0000 mg | ORAL_TABLET | Freq: Two times a day (BID) | ORAL | Status: DC
  Administered 2015-06-09 – 2015-06-10 (×2): 250 mg via ORAL
  Filled 2015-06-09 (×2): qty 1

## 2015-06-09 NOTE — Progress Notes (Signed)
[facility] PROGRESS NOTE  Ronnie Weeks [id number] DOB: 06-Jan-1926 DOA: 06/07/2015 PCP: No primary care provider on file.  Interim summary and HPI 82 y.o. gentleman with a history of HTN, HLD, IDDM, reported atrial fibrillation (does not appear to be anticoagulated), CHF (type unknown), and dementia (only oriented to self) who is unable to give any meaningful history at this time. This H/P taken from limited EMR and interview with his sister-in-law/POA [name] by phone. This is his first admission to this hospital; he has been admitted to [facility] in the past. Essentially, he has 2-3 weeks of gradual decline, including decreased mobility, decreased appetite, and intermittent noncompliance with taking his oral medications (he sometimes spits them out). He has had recurrent falls. He was been evaluated by outpatient providers at the request of his ALF, and he was recently diagnosed with a UTI and prescribed amoxicillin for it. His family had also been told that he was no longer appropriate for assisted living due to his level of debility and were given 30 days to make arrangements for SNF. Today, he had a fall while the patient care techs were trying to get him up.  Completing work up and evaluation; needs 3 night for placement. Anticipate discharge to SNF on 06/10/15. Please add order for palliative at SNF, for advance directives, MUST form and follow up in case he declines further.   Assessment/Plan: Subacute CVA --Neurology consult greatly appreciated --MRI showing multiple acute/subacute infarcts suggesting embolic source  --Patient with a history of atrial fibrillation and was not on anticoagulation due to high risk for falls. Now that he will go to SNF and controlled environment plan is to start Eliquis for secondary prevention (see below for timing) --We will continue aspirin and Plavix for secondary prevention for now; planning for initiation of Eliquis in 5 days for secondary  prevention (given moderate size stroke and risk for conversion to hemorrhagic stroke)  --complete stroke protocol --Telemetry monitoring --Continue Lipitor  Hyperlipidemia --Will continue statins --LDL 29  AKI with hyperkalemia --Hydration with NS provided --Cr down to 1.24 --Appears to be secondary to dehydration and prerenal azotemia, UTI and continue use of nephrotoxic agents --will follow trend; continue treatment for UTI and continue holding nephrotoxic agents for another 24 hours  UTI --Urine culture with insignificant colonies growth  --will d/c IV antibiotics and consolidate treatment with ceftin (last dose 6/9) --no fever, no dysuria  Diarrhea --Enteric precautions was started in ED; but discontinued now --no further loose stools appreciated  --Patient uses chronically laxatives as part of his medication regimen and was recently started on amoxicillin for UTI --No abdominal pain, no nausea, no vomiting.  --C. Diff was not sent (given use of laxatives and no diarrhea since admission) --will monitor for now.  Bilirubin of 3 of unclear etiology --RUQ ultrasound demonstrating hepatic steatosis only --No icterus --Monitor LFT's intermittently   Bradycardia --Continue holding Beta blocker now and observe --Heart rate in the 40-55 range --While in no exertion declines any complaints  Atrial fibrillation --CHADsVASC score 4-5 --Prior to admission was considered not a candidate for anticoagulation (risk for falls) --rate controlled/to slightly bradycardia; holding B-blockers currently --per neurology rec's and given the fact he will be going to controlled environment (SNF) and the findings of moderate embolic stroke; will continue ASA and Plavix for now; then in 5 days plan is to start treatment with eliquis for secondary prevention (2.5mg  BID), at that time ASA and plavix will be discontinued   DM --We will continue holding  Januvia and metformin while  inpatient --Continue SSI coverage AC/HS and lantus; last one will need adjustment for better control at discharge --A1C 9.4 --continue carb modified diet   History of dementia with depression/behavioral issues --Continue aricept and namenda --continue supportive care and continue Celexa/Seroquel  HTN --Continue amlodipine --BP is stable currently   Code Status:DNR/DNI Family Communication: no family at bedside  Disposition Plan: to be determine; came from ALF, but actively declining and not longer a candidate for ALF giving increase need of assistance and care. Will complete work up and follow rec's from neurology/PT. Will need SNF with palliative care follow up at discharge.   Consultants:  Neurology   Procedures: MRI: Acute left parietal-occipital lobe nonhemorrhagic infarct. Acute/subacute nonhemorrhagic left temporal - occipital lobe infarct. Acute/subacute small nonhemorrhagic left thalamic infarct. Acute/ subacute right temporal-occipital lobe nonhemorrhagic  infarct.  2-D echo: - Left ventricle: Septal and apical hypokinesis The cavity size was  mildly dilated. Systolic function was mildly reduced. The  estimated ejection fraction was in the range of 45% to 50%. Left  ventricular diastolic function parameters were normal. - Aortic valve: There was mild stenosis. There was mild  regurgitation. Valve area (VTI): 1.11 cm^2. Valve area (Vmax):  0.94 cm^2. Valve area (Vmean): 0.9 cm^2. - Mitral valve: There was mild regurgitation. - Left atrium: The atrium was mildly dilated. - Atrial septum: No defect or patent foramen ovale was identified.  Carotid duplex: No evidence of a significant stenosis in bilateral carotid arteries - 1-39%. Vertebral arteries demonstrate antegrade flow.  Antibiotics:  Rocephin 6/4>>>6/6  ceftin 6/6>>6/09  HPI/Subjective: Afebrile, no CP or SOB. In no acute distress and only oriented X1. Poor insight due to dementia.    Objective: Filed Vitals:   06/09/15 0517 06/09/15 1004  BP: 169/55 166/49  Pulse: 58 62  Temp: 98.8 F (37.1 C) 99.1 F (37.3 C)  Resp: 18 15    Intake/Output Summary (Last 24 hours) at 06/09/15 1607 Last data filed at 06/09/15 1210  Gross per 24 hour  Intake    477 ml  Output      0 ml  Net    477 ml   Filed Weights   06/07/15 2329  Weight: 90.3 kg (199 lb 1.2 oz)    Exam:   General:  Afebrile, no CP, no SOB, oriented X1. In no distress. Pleasantly confused. Poor insight on exam.   Cardiovascular: rate controlled, no rubs or gallops   Respiratory: no wheezing, no crackles  Abdomen: soft, NT, ND, positive BS  Musculoskeletal: no cyanosis or clubbing   Neuro: moving 4 limbs, no focal motor deficit; CN grossly intact   Data Reviewed: Basic Metabolic Panel:  Recent Labs Lab 06/07/15 1930 06/07/15 2332 06/08/15 0510  NA 135 137 138  K 5.5* 4.4 4.4  CL 105 108 108  CO2 25 25 20*  GLUCOSE 112* 97 75  BUN 29* 25* 22*  CREATININE 1.52* 1.39* 1.24  CALCIUM 9.8 9.5 8.9   Liver Function Tests:  Recent Labs Lab 06/07/15 1930 06/08/15 0510  AST 38 25  ALT 18 14*  ALKPHOS 63 57  BILITOT 3.0* 1.8*  PROT 6.6 6.0*  ALBUMIN 3.0* 2.8*   CBC:  Recent Labs Lab 06/07/15 1930  WBC 13.5*  NEUTROABS 9.9*  HGB 12.7*  HCT 39.0  MCV 85.3  PLT 317   CBG:  Recent Labs Lab 06/08/15 1154 06/08/15 1647 06/08/15 2120 06/09/15 0653 06/09/15 1132  GLUCAP 73 135* 216* 137* 249*  Recent Results (from the past 240 hour(s))  Urine culture     Status: Abnormal   Collection Time: 06/07/15  7:30 PM  Result Value Ref Range Status   Specimen Description URINE, CATHETERIZED  Final   Special Requests unknown Normal  Final   Culture <10,000 COLONIES/mL INSIGNIFICANT GROWTH (A)  Final   Report Status 06/08/2015 FINAL  Final     Studies: Ct Head Wo Contrast  06/07/2015  CLINICAL DATA:  Increasing lethargy for 12 hours. Fall today. Recent urinary tract  infection. EXAM: CT HEAD WITHOUT CONTRAST TECHNIQUE: Contiguous axial images were obtained from the base of the skull through the vertex without intravenous contrast. COMPARISON:  None. FINDINGS: Brain: There is no evidence of acute intracranial hemorrhage, mass lesion or acute extra-axial fluid collection. There is a possible arachnoid cyst in the left middle cranial fossa. The ventricles and subarachnoid spaces are diffusely prominent, consistent with atrophy. There is ill-defined low-density in the left parietal occipital lobes suspicious for a subacute stroke in the left PCA distribution. There are mild chronic small vessel ischemic changes in the periventricular white matter. There is no hydrocephalus. Intracranial vascular calcifications are present. Bones/sinuses/visualized face: There is mucosal thickening in the maxillary and ethmoid sinuses bilaterally with a possible right maxillary sinus air-fluid level. The visualized paranasal sinuses, mastoid air cells and middle ears are otherwise clear. The calvarium is intact. IMPRESSION: 1. Ill-defined low-density in the left parietal occipital lobe suspicious for a subacute nonhemorrhagic stroke in the PCA distribution. 2. Atrophy and mild chronic small vessel ischemic changes. 3. Paranasal sinus mucosal thickening with possible right maxillary sinus air-fluid level. Electronically Signed   By: Richardean Sale M.D.   On: 06/07/2015 20:49   Mr Brain Wo Contrast  06/08/2015  CLINICAL DATA:  82 year old diabetic hypertensive male with dementia presenting with 2-3 gradual decline. Recurrent falls. Subsequent encounter. EXAM: MRI HEAD WITHOUT CONTRAST MRA HEAD WITHOUT CONTRAST TECHNIQUE: Multiplanar, multiecho pulse sequences of the brain and surrounding structures were obtained without intravenous contrast. Angiographic images of the head were obtained using MRA technique without contrast. COMPARISON:  06/06/2005 CT.  10/02/2007 MR. FINDINGS: MRI HEAD  FINDINGS Acute left parietal-occipital lobe nonhemorrhagic infarct. Acute/subacute nonhemorrhagic left temporal - occipital lobe infarct. Acute/subacute small nonhemorrhagic left thalamic infarct. Acute/ subacute right temporal-occipital lobe nonhemorrhagic infarct. With involvement of the occipital lobes bilaterally, one could question posterior reversible encephalopathy syndrome however this is felt to be less likely consideration than infarction. Global moderate atrophy without hydrocephalus. No intracranial mass lesion noted on this unenhanced exam. Cervical spondylotic changes with spinal stenosis and slight cord flattening C3-4 level. Cervical medullary junction unremarkable. Prominent polypoid opacification maxillary sinuses measuring up to 9 mm. Air-fluid level right maxillary sinus suggesting acute sinusitis. Mucosal thickening ethmoid sinus air cells. Partial opacification mastoid air cells without obstructing lesion of eustachian tube noted. MRA HEAD FINDINGS Moderate to marked tandem stenosis throughout the right posterior cerebral artery. Mild to moderate narrowing proximal left posterior cerebral artery with marked narrowing mid to posterior left posterior cerebral artery. Moderate narrowing superior cerebellar artery bilaterally. Mild narrowing basilar artery without high-grade stenosis. Tiny bulge left lateral aspect probably origin of the left anterior inferior cerebellar artery (which is poorly delineated) rather than aneurysm. Nonvisualized right anterior inferior cerebellar artery. Mild narrowing distal vertebral arteries bilaterally. Moderate to marked narrowing of portions of the posterior inferior cerebellar artery bilaterally. Moderate narrowing right internal carotid artery cavernous segment. Moderate to marked focal narrowing left internal carotid artery cavernous/ supraclinoid junction.  Prominence of the origin of the ophthalmic artery bilaterally. Tiny aneurysms possible although I  suspect this may represent ectasia of the origin of the ophthalmic artery rather than aneurysm. Mildly hypoplastic A1 segment right anterior cerebral artery. Moderate focal stenosis distal M1 segment right middle cerebral artery. Mild to moderate narrowing middle cerebral artery branches greater on the right. IMPRESSION: MRI HEAD Acute left parietal-occipital lobe nonhemorrhagic infarct. Acute/subacute nonhemorrhagic left temporal - occipital lobe infarct. Acute/subacute small nonhemorrhagic left thalamic infarct. Acute/ subacute right temporal-occipital lobe nonhemorrhagic infarct. Global moderate atrophy without hydrocephalus. Prominent polypoid opacification maxillary sinuses measuring up to 9 mm. Air-fluid level right maxillary sinus suggesting acute sinusitis. Mucosal thickening ethmoid sinus air cells. Partial opacification mastoid air cells. MRA HEAD FINDINGS Moderate to marked tandem stenosis throughout the right posterior cerebral artery. Mild to moderate narrowing proximal left posterior cerebral artery with marked narrowing mid to posterior left posterior cerebral artery. Moderate narrowing superior cerebellar artery bilaterally. Mild narrowing basilar artery without high-grade stenosis. Tiny bulge left lateral aspect probably origin of the left anterior inferior cerebellar artery (which is poorly delineated) rather than aneurysm. Nonvisualized right anterior inferior cerebellar artery. Moderate to marked narrowing of portions of the posterior inferior cerebellar artery bilaterally. Moderate narrowing right internal carotid artery cavernous segment. Moderate to marked focal narrowing left internal carotid artery cavernous/ supraclinoid junction. Prominence of the origin of the ophthalmic artery bilaterally. Tiny aneurysms possible although I suspect this may represent ectasia of the origin of the ophthalmic artery rather than aneurysm. Moderate focal stenosis distal M1 segment right middle cerebral  artery. Mild to moderate narrowing middle cerebral artery branches greater on the right. Electronically Signed   By: Genia Del M.D.   On: 06/08/2015 07:21   Dg Chest Port 1 View  06/07/2015  CLINICAL DATA:  Acute mental status change EXAM: PORTABLE CHEST 1 VIEW COMPARISON:  None. FINDINGS: The heart size and mediastinal contours are within normal limits. Both lungs are clear. The visualized skeletal structures are unremarkable. IMPRESSION: No active disease. Electronically Signed   By: Dorise Bullion III M.D   On: 06/07/2015 21:27   Mr Jodene Nam Head/brain Wo Cm  06/08/2015  CLINICAL DATA:  82 year old diabetic hypertensive male with dementia presenting with 2-3 gradual decline. Recurrent falls. Subsequent encounter. EXAM: MRI HEAD WITHOUT CONTRAST MRA HEAD WITHOUT CONTRAST TECHNIQUE: Multiplanar, multiecho pulse sequences of the brain and surrounding structures were obtained without intravenous contrast. Angiographic images of the head were obtained using MRA technique without contrast. COMPARISON:  06/06/2005 CT.  10/02/2007 MR. FINDINGS: MRI HEAD FINDINGS Acute left parietal-occipital lobe nonhemorrhagic infarct. Acute/subacute nonhemorrhagic left temporal - occipital lobe infarct. Acute/subacute small nonhemorrhagic left thalamic infarct. Acute/ subacute right temporal-occipital lobe nonhemorrhagic infarct. With involvement of the occipital lobes bilaterally, one could question posterior reversible encephalopathy syndrome however this is felt to be less likely consideration than infarction. Global moderate atrophy without hydrocephalus. No intracranial mass lesion noted on this unenhanced exam. Cervical spondylotic changes with spinal stenosis and slight cord flattening C3-4 level. Cervical medullary junction unremarkable. Prominent polypoid opacification maxillary sinuses measuring up to 9 mm. Air-fluid level right maxillary sinus suggesting acute sinusitis. Mucosal thickening ethmoid sinus air  cells. Partial opacification mastoid air cells without obstructing lesion of eustachian tube noted. MRA HEAD FINDINGS Moderate to marked tandem stenosis throughout the right posterior cerebral artery. Mild to moderate narrowing proximal left posterior cerebral artery with marked narrowing mid to posterior left posterior cerebral artery. Moderate narrowing superior cerebellar artery bilaterally. Mild narrowing basilar  artery without high-grade stenosis. Tiny bulge left lateral aspect probably origin of the left anterior inferior cerebellar artery (which is poorly delineated) rather than aneurysm. Nonvisualized right anterior inferior cerebellar artery. Mild narrowing distal vertebral arteries bilaterally. Moderate to marked narrowing of portions of the posterior inferior cerebellar artery bilaterally. Moderate narrowing right internal carotid artery cavernous segment. Moderate to marked focal narrowing left internal carotid artery cavernous/ supraclinoid junction. Prominence of the origin of the ophthalmic artery bilaterally. Tiny aneurysms possible although I suspect this may represent ectasia of the origin of the ophthalmic artery rather than aneurysm. Mildly hypoplastic A1 segment right anterior cerebral artery. Moderate focal stenosis distal M1 segment right middle cerebral artery. Mild to moderate narrowing middle cerebral artery branches greater on the right. IMPRESSION: MRI HEAD Acute left parietal-occipital lobe nonhemorrhagic infarct. Acute/subacute nonhemorrhagic left temporal - occipital lobe infarct. Acute/subacute small nonhemorrhagic left thalamic infarct. Acute/ subacute right temporal-occipital lobe nonhemorrhagic infarct. Global moderate atrophy without hydrocephalus. Prominent polypoid opacification maxillary sinuses measuring up to 9 mm. Air-fluid level right maxillary sinus suggesting acute sinusitis. Mucosal thickening ethmoid sinus air cells. Partial opacification mastoid air cells. MRA HEAD  FINDINGS Moderate to marked tandem stenosis throughout the right posterior cerebral artery. Mild to moderate narrowing proximal left posterior cerebral artery with marked narrowing mid to posterior left posterior cerebral artery. Moderate narrowing superior cerebellar artery bilaterally. Mild narrowing basilar artery without high-grade stenosis. Tiny bulge left lateral aspect probably origin of the left anterior inferior cerebellar artery (which is poorly delineated) rather than aneurysm. Nonvisualized right anterior inferior cerebellar artery. Moderate to marked narrowing of portions of the posterior inferior cerebellar artery bilaterally. Moderate narrowing right internal carotid artery cavernous segment. Moderate to marked focal narrowing left internal carotid artery cavernous/ supraclinoid junction. Prominence of the origin of the ophthalmic artery bilaterally. Tiny aneurysms possible although I suspect this may represent ectasia of the origin of the ophthalmic artery rather than aneurysm. Moderate focal stenosis distal M1 segment right middle cerebral artery. Mild to moderate narrowing middle cerebral artery branches greater on the right. Electronically Signed   By: Genia Del M.D.   On: 06/08/2015 07:21   US Abdomen Limited Ruq  06/08/2015  CLINICAL DATA:  Abnormal liver function tests.  Cholecystectomy. EXAM: US ABDOMEN LIMITED - RIGHT UPPER QUADRANT COMPARISON:  CT 05/23/2015 FINDINGS: Gallbladder: Surgically absent Common bile duct: Diameter: Upper limits of normal at 6 mm. Liver: Liver is diffusely increased in echogenicity. No focal lesion or duct dilatation. IMPRESSION: Increased liver echogenicity commonly represents hepatic steatosis. Electronically Signed   By: Suzy Bouchard M.D.   On: 06/08/2015 09:33    Scheduled Meds: . amLODipine  5 mg Oral Daily  . aspirin  162 mg Oral Daily  . atorvastatin  10 mg Oral Daily  . cefTRIAXone (ROCEPHIN)  IV  1 g Intravenous Q24H  . citalopram  20 mg  Oral Daily  . clopidogrel  75 mg Oral Daily  . donepezil  10 mg Oral QHS  . insulin aspart  0-9 Units Subcutaneous TID WC  . insulin glargine  10 Units Subcutaneous QHS  . memantine  28 mg Oral Daily  . QUEtiapine  25 mg Oral QHS   Continuous Infusions:   Principal Problem:   Stroke [facility]) Active Problems:   UTI (lower urinary tract infection)   Leukocytosis   AKI (acute kidney injury) ([place])   Abnormal bilirubin test   Hyperkalemia   Diarrhea   Bradycardia   Dementia   HTN (hypertension)   Diabetes ([place])  Atrial fibrillation ([facility])   CHF (congestive heart failure) ([place])   CVA (cerebral infarction)    Time spent: 30 minutes    Barton Dubois  [facility] Pager [phone]. If 7PM-7AM, please contact night-coverage at [email or web address], password [facility] 06/09/2015, 4:07 PM  LOS: 2 days

## 2015-06-09 NOTE — Progress Notes (Signed)
Patient is a feeder

## 2015-06-09 NOTE — Evaluation (Signed)
SLP Cancellation Note  Patient Details Name: Ronnie Weeks MRN: [id number] DOB: 1926/08/21   Cancelled treatment:       Reason Eval/Treat Not Completed: Other (comment);Patient at procedure or test/unavailable (pt "eating" per family; will reattempt eval later, note pt for SNF per chart review)   Luanna Salk, [facility] SLP [phone]

## 2015-06-09 NOTE — Evaluation (Signed)
Occupational Therapy Evaluation Patient Details Name: Ronnie Weeks MRN: [id number] DOB: 1926/01/05 Today's Date: 06/09/2015    History of Present Illness Patient is a 82 y/o male with hx of DM, HTN, HLD, CHF, dementia and A-fib presents with 2-3 weeks of gradual decline, including decreased mobility, decreased appetite, falls and intermittent noncompliance with taking his oral medications. CT head-subacute CVA in the left PCA distribution. MRI-Acute left parietal-occipital lobe nonhemorrhagic infarct.   Clinical Impression   Patient presenting with decreased ADL and functional mobility independence secondary to above. Unsure of patient's PLOF PTA. Patient currently functioning at an overall min to max assist level. Patient will benefit from acute OT to increase overall independence in the areas of ADLs, functional mobility, and overall safety in order to safely discharge to venue listed below.     Follow Up Recommendations  SNF;Supervision/Assistance - 24 hour    Equipment Recommendations  Other (comment) (TBD next venue of care)    Recommendations for Other Services  None at this time    Precautions / Restrictions Precautions Precautions: Fall Precaution Comments: visual deficits Restrictions Weight Bearing Restrictions: No     Mobility Bed Mobility Overal bed mobility: Needs Assistance Bed Mobility: Supine to Sit     Supine to sit: Min guard;HOB elevated     General bed mobility comments: No assist needed. Use of rail. Multimodal cueing for technique and initiation.   Transfers Overall transfer level: Needs assistance Equipment used: None Transfers: Sit to/from [facility] Sit to Stand: Mod assist Stand pivot transfers: Mod assist       General transfer comment: mod assist to power up and increased assistance needed for pivot to recliner    Balance Overall balance assessment: History of Falls;Needs assistance Sitting-balance support: Feet  supported;No upper extremity supported Sitting balance-Leahy Scale: Fair     Standing balance support: During functional activity Standing balance-Leahy Scale: Poor Standing balance comment: Pt holding onto therapist during stand pivot transfer, unable to let go and increased anxiety    ADL Overall ADL's : Needs assistance/impaired Eating/Feeding: Set up;Sitting   Grooming: Minimal assistance;Sitting   Upper Body Bathing: Minimal assitance;Sitting   Lower Body Bathing: Moderate assistance;Sit to/from stand   Upper Body Dressing : Minimal assistance;Sitting   Lower Body Dressing: Maximal assistance;Sit to/from stand   Toilet Transfer: Moderate assistance;Stand-pivot Toilet Transfer Details (indicate cue type and reason): simulated EOB to recliner transfer  General ADL Comments: Pt able to follow simple one step commands. During ADL he requires multimodal cueing and constant cueing for initiation and problem solving.      Vision Vision Assessment?: Yes;Vision impaired- to be further tested in functional context Alignment/Gaze Preference: Chin down Tracking/Visual Pursuits: Impaired - to be further tested in functional context Visual Fields: Impaired-to be further tested in functional context          Pertinent Vitals/Pain Pain Assessment: Faces Pain Score: 0-No pain Faces Pain Scale: No hurt     Hand Dominance  (unsure)   Extremity/Trunk Assessment Upper Extremity Assessment Upper Extremity Assessment: Generalized weakness   Lower Extremity Assessment Lower Extremity Assessment: Defer to PT evaluation   Cervical / Trunk Assessment Cervical / Trunk Assessment: Normal   Communication Communication Communication: No difficulties   Cognition Arousal/Alertness: Awake/alert Behavior During Therapy: Flat affect Overall Cognitive Status: No family/caregiver present to determine baseline cognitive functioning              Home Living Family/patient expects to be  discharged to:: Skilled nursing facility   Prior  Functioning/Environment Level of Independence: Needs assistance  Gait / Transfers Assistance Needed: Reports having assist with walking ADL's / Homemaking Assistance Needed: Assuming assist with ADLs at ALF.   Comments: Pt not able to provide PLOF/history due to hx of dementia. No family members present. Per MD notes, pt has had a gradual decline over the last 3-4 weeks with falls and ALF not able to care for him anymore.    OT Diagnosis: Generalized weakness;Cognitive deficits;Disturbance of vision   OT Problem List: Decreased strength;Decreased activity tolerance;Decreased range of motion;Impaired balance (sitting and/or standing);Impaired vision/perception;Decreased cognition;Decreased safety awareness;Decreased knowledge of use of DME or AE;Decreased knowledge of precautions   OT Treatment/Interventions: Self-care/ADL training;Therapeutic exercise;Neuromuscular education;DME and/or AE instruction;Therapeutic activities;Cognitive remediation/compensation;Visual/perceptual remediation/compensation;Patient/family education;Balance training    OT Goals(Current goals can be found in the care plan section) Acute Rehab OT Goals Patient Stated Goal: none stated OT Goal Formulation: With patient Time For Goal Achievement: 06/23/15 Potential to Achieve Goals: Fair ADL Goals Pt Will Perform Grooming: with min assist;standing Pt Will Perform Lower Body Bathing: with min assist;sit to/from stand Pt Will Perform Lower Body Dressing: with min assist;sit to/from stand Pt Will Transfer to Toilet: with min assist;bedside commode;stand pivot transfer Pt/caregiver will Perform Home Exercise Program: Both right and left upper extremity;Increased strength;With minimal assist;With written HEP provided Additional ADL Goal #1: Pt will complete vision exercises with therapist  OT Frequency: Min 3X/week   Barriers to D/C: Unknown   End of Session Equipment  Utilized During Treatment: Gait belt Nurse Communication: Mobility status  Activity Tolerance: Patient tolerated treatment well Patient left: in chair;with call bell/phone within reach;with chair alarm set   Time: [id number] OT Time Calculation (min): 20 min Charges:  OT General Charges $OT Visit: 1 Procedure OT Evaluation $OT Eval Moderate Complexity: 1 Procedure  Chrys Racer , MS, OTR/L, CLT Pager: [phone]  06/09/2015, 5:18 PM

## 2015-06-09 NOTE — Progress Notes (Signed)
  Echocardiogram 2D Echocardiogram has been performed.  Diamond Nickel 06/09/2015, 2:44 PM

## 2015-06-09 NOTE — Care Management Important Message (Signed)
Important Message  Patient Details  Name: Ronnie Weeks MRN: [id number] Date of Birth: 12-27-1926   Medicare Important Message Given:  Yes    Loann Quill 06/09/2015, 8:25 AM

## 2015-06-09 NOTE — Progress Notes (Signed)
OT Cancellation Note  Patient Details Name: Ronnie Weeks MRN: [id number] DOB: 21-Feb-1926   Cancelled Treatment:    Reason Eval/Treat Not Completed: Patient at procedure or test/ unavailable (ECHO currently)  [name]   OTR/L Pager: [phone] Office: [phone] .  06/09/2015, 2:21 PM

## 2015-06-09 NOTE — Progress Notes (Signed)
Patient has been refusing lunch, dinner. Patient picked food options for lunch and dinner. Attempted to assist patient with eating. Patient still refused. Offered patient [place] sandwich, he refused that. Patient drank full glucerna for lunch and one for dinner.

## 2015-06-10 DIAGNOSIS — I509 Heart failure, unspecified: Secondary | ICD-10-CM | POA: Diagnosis not present

## 2015-06-10 DIAGNOSIS — R488 Other symbolic dysfunctions: Secondary | ICD-10-CM | POA: Diagnosis not present

## 2015-06-10 DIAGNOSIS — R2681 Unsteadiness on feet: Secondary | ICD-10-CM | POA: Diagnosis not present

## 2015-06-10 DIAGNOSIS — E119 Type 2 diabetes mellitus without complications: Secondary | ICD-10-CM | POA: Diagnosis not present

## 2015-06-10 DIAGNOSIS — R2689 Other abnormalities of gait and mobility: Secondary | ICD-10-CM | POA: Diagnosis not present

## 2015-06-10 DIAGNOSIS — I63433 Cerebral infarction due to embolism of bilateral posterior cerebral arteries: Secondary | ICD-10-CM | POA: Diagnosis not present

## 2015-06-10 DIAGNOSIS — F0391 Unspecified dementia with behavioral disturbance: Secondary | ICD-10-CM | POA: Diagnosis not present

## 2015-06-10 DIAGNOSIS — E875 Hyperkalemia: Secondary | ICD-10-CM | POA: Diagnosis not present

## 2015-06-10 DIAGNOSIS — I69359 Hemiplegia and hemiparesis following cerebral infarction affecting unspecified side: Secondary | ICD-10-CM | POA: Diagnosis not present

## 2015-06-10 DIAGNOSIS — I4891 Unspecified atrial fibrillation: Secondary | ICD-10-CM | POA: Diagnosis not present

## 2015-06-10 DIAGNOSIS — R296 Repeated falls: Secondary | ICD-10-CM | POA: Diagnosis not present

## 2015-06-10 DIAGNOSIS — M6281 Muscle weakness (generalized): Secondary | ICD-10-CM | POA: Diagnosis not present

## 2015-06-10 DIAGNOSIS — R5381 Other malaise: Secondary | ICD-10-CM | POA: Diagnosis not present

## 2015-06-10 DIAGNOSIS — Z87440 Personal history of urinary (tract) infections: Secondary | ICD-10-CM | POA: Diagnosis not present

## 2015-06-10 LAB — BASIC METABOLIC PANEL
ANION GAP: 11 (ref 5–15)
BUN: 6 mg/dL (ref 6–20)
CHLORIDE: 101 mmol/L (ref 101–111)
CO2: 25 mmol/L (ref 22–32)
Calcium: 9.2 mg/dL (ref 8.9–10.3)
Creatinine, Ser: 1.03 mg/dL (ref 0.61–1.24)
GFR calc Af Amer: >60 mL/min (ref >60)
Glucose, Bld: 167 mg/dL — ABNORMAL HIGH (ref 65–99)
POTASSIUM: 4.1 mmol/L (ref 3.5–5.1)
SODIUM: 137 mmol/L (ref 135–145)

## 2015-06-10 LAB — GLUCOSE, CAPILLARY
GLUCOSE-CAPILLARY: 173 mg/dL — AB (ref 65–99)
GLUCOSE-CAPILLARY: 172 mg/dL — AB (ref 65–99)
Glucose-Capillary: 161 mg/dL — ABNORMAL HIGH (ref 65–99)

## 2015-06-10 MED ORDER — ATORVASTATIN CALCIUM 10 MG PO TABS: 10.0000 mg | ORAL_TABLET | Freq: Every day | ORAL | Status: AC

## 2015-06-10 MED ORDER — INSULIN ASPART 100 UNIT/ML ~~LOC~~ SOLN: 0.0000 [IU] | Freq: Three times a day (TID) | SUBCUTANEOUS | Status: AC

## 2015-06-10 MED ORDER — INSULIN GLARGINE 100 UNIT/ML ~~LOC~~ SOLN: 12.0000 [IU] | Freq: Every day | SUBCUTANEOUS | Status: AC

## 2015-06-10 MED ORDER — CEFUROXIME AXETIL 250 MG PO TABS: 250.0000 mg | ORAL_TABLET | Freq: Two times a day (BID) | ORAL | Status: DC

## 2015-06-10 MED ORDER — APIXABAN 5 MG PO TABS: 5.0000 mg | ORAL_TABLET | Freq: Two times a day (BID) | ORAL | Status: AC

## 2015-06-10 MED ORDER — CEFUROXIME AXETIL 250 MG PO TABS: 250.0000 mg | ORAL_TABLET | Freq: Two times a day (BID) | ORAL | Status: AC

## 2015-06-10 MED ORDER — ASPIRIN 81 MG PO CHEW: 81.0000 mg | CHEWABLE_TABLET | Freq: Every day | ORAL | Status: AC

## 2015-06-10 MED ORDER — CARVEDILOL 25 MG PO TABS: 12.5000 mg | ORAL_TABLET | Freq: Two times a day (BID) | ORAL | Status: DC

## 2015-06-10 MED ORDER — CLOPIDOGREL BISULFATE 75 MG PO TABS: 75.0000 mg | ORAL_TABLET | Freq: Every day | ORAL | Status: AC

## 2015-06-10 MED ORDER — CARVEDILOL 12.5 MG PO TABS: 12.5000 mg | ORAL_TABLET | Freq: Two times a day (BID) | ORAL | Status: AC

## 2015-06-10 NOTE — Clinical Social Work Placement (Signed)
   CLINICAL SOCIAL WORK PLACEMENT  NOTE  Date:  06/10/2015  Patient Details  Name: Ronnie Weeks MRN: [id number] Date of Birth: Nov 05, 1926  Clinical Social Work is seeking post-discharge placement for this patient at the [place] level of care (*CSW will initial, date and re-position this form in  chart as items are completed):  Yes   Patient/family provided with [place] Work Department's list of facilities offering this level of care within the geographic area requested by the patient (or if unable, by the patient's family).  Yes   Patient/family informed of their freedom to choose among providers that offer the needed level of care, that participate in Medicare, Medicaid or managed care program needed by the patient, have an available bed and are willing to accept the patient.  Yes   Patient/family informed of [place]'s ownership interest in [facility] and [facility], as well as of the fact that they are under no obligation to receive care at these facilities.  PASRR submitted to EDS on 06/08/15     PASRR number received on 06/08/15     Existing PASRR number confirmed on       FL2 transmitted to all facilities in geographic area requested by pt/family on 06/08/15     FL2 transmitted to all facilities within larger geographic area on       Patient informed that his/her managed care company has contracts with or will negotiate with certain facilities, including the following:        Yes   Patient/family informed of bed offers received.  Patient chooses bed at [facility]     Physician recommends and patient chooses bed at      Patient to be transferred to [facility] on 06/10/15.  Patient to be transferred to facility by [facility]     Patient family notified on 06/10/15 of transfer.  Name of family member notified:  Juliann Pulse     PHYSICIAN Please sign FL2     Additional Comment:     _______________________________________________ Cranford Mon, LCSW 06/10/2015, 1:52 PM

## 2015-06-10 NOTE — Progress Notes (Signed)
Patient will discharge to [facility] Anticipated discharge date: 6/7 Family notified: Kathy/[facility] Transportation by [facility]- called at 1:40pm  CSW signing off.  Domenica Reamer, Altha Social Worker [phone]

## 2015-06-10 NOTE — Progress Notes (Signed)
Discharge orders received.  VSS upon discharge.  IV removed and education complete with family.  Transported via [facility].  Report called to [place] at [facility]).  Cori Razor, RN

## 2015-06-10 NOTE — Discharge Summary (Signed)
Ronnie Weeks, is a 82 y.o. male  DOB 05-Sep-1926  MRN [id number].  Admission date:  06/07/2015  Admitting Physician  Lily Kocher, MD  Discharge Date:  06/10/2015   Primary MD  No primary care provider on file.  Recommendations for primary care physician for things to follow:  - Patient to discontinue aspirin and Plavix on 6/9, and to start Eliquis on 6/10. - Follow with neurology in 2 month - check CBC, BMP in 1 week - Please consult palliative at SNF   Admission Diagnosis  Cerebrovascular accident (CVA) due to occlusion of cerebral artery ([place]) [I63.50]   Discharge Diagnosis  Cerebrovascular accident (CVA) due to occlusion of cerebral artery ([place]) [I63.50]    Principal Problem:   Stroke [facility]) Active Problems:   UTI (lower urinary tract infection)   Leukocytosis   AKI (acute kidney injury) ([place])   Abnormal bilirubin test   Hyperkalemia   Diarrhea   Bradycardia   Dementia   HTN (hypertension)   Diabetes ([place])   Atrial fibrillation (HCC)   CHF (congestive heart failure) (HCC)   CVA (cerebral infarction)      Past Medical History  Diagnosis Date  . Diabetes mellitus without complication ([place])   . Hypertension   . Hyperlipidemia   . CHF (congestive heart failure) ([place])   . Aortic valve stenosis   . A-fib ([place])   . Dementia     Past Surgical History  Procedure Laterality Date  . Appendectomy    . Cholecystectomy    . Elbow fracture surgery         History of present illness and  Hospital Course:     Kindly see H&P for history of present illness and admission details, please review complete Labs, Consult reports and Test reports for all details in brief  HPI  from the history and physical done on the day of admission 06/07/2015 HPI: Ronnie Weeks is a 82 y.o. gentleman with a history of HTN, HLD, IDDM, reported atrial fibrillation (does not appear to be anticoagulated), CHF (type  unknown), and dementia (only oriented to self) who is unable to give any meaningful history at this time. This H/P taken from limited EMR and interview with his sister-in-law/POA [name] by phone. This is his first admission to this hospital; he has been admitted to [facility] in the past. Essentially, he has 2-3 weeks of gradual decline, including decreased mobility, decreased appetite, and intermittent noncompliance with taking his oral medications (he sometimes spits them out). He has had recurrent falls. He was been evaluated by outpatient providers at the request of his ALF, and he was recently diagnosed with a UTI and prescribed amoxicillin for it. His family had also been told that he was no longer appropriate for assisted living due to his level of debility and were given 30 days to make arrangements for SNF. Today, he had a fall while the patient care techs were trying to get him up. He his his head on the night stand. This is what prompted transfer to  the ED this evening.   ED Course: Unfortunately, CT of the head shows subacute CVA in the left PCA distribution. He also has evidence of AKI with mild hyperkalemia (no EKG changes), sinus bradycardia, and persistent UTI. He has been evaluated by the neurohospitalist and needs admission for complete stroke work-up.   Hospital Course  82 y.o. gentleman with a history of HTN, HLD, IDDM, reported atrial fibrillation (does not appear to be anticoagulated), CHF (type unknown), and dementia (only oriented to self) who is unable to give any meaningful history at this time. This H/P taken from limited EMR and interview with his sister-in-law/POA [name] by phone. This is his first admission to this hospital; he has been admitted to [facility] in the past. Essentially, he has 2-3 weeks of gradual decline, including decreased mobility, decreased appetite, and intermittent noncompliance with taking his oral medications (he sometimes spits them out). He has  had recurrent falls. He was been evaluated by outpatient providers at the request of his ALF, and he was recently diagnosed with a UTI and prescribed amoxicillin for it. His family had also been told that he was no longer appropriate for assisted living .  Subacute CVA --Neurology consult greatly appreciated --MRI showing multiple acute/subacute infarcts suggesting embolic source  --Patient with a history of atrial fibrillation and was not on anticoagulation due to high risk for falls. Now that he will go to SNF and controlled environment plan is to start Eliquis for secondary prevention (see below for timing). --We will continue aspirin and Plavix for secondary prevention for now; planning for initiation of Eliquis in 2 days for secondary prevention (given moderate size stroke and risk for conversion to hemorrhagic stroke)  --complete stroke protocol --Continue Lipitor  Hyperlipidemia --Will continue statins at a lower dose was increased from 40-10. --LDL 29  AKI with hyperkalemia --Hydration with NS provided --Cr down to 1.03 --Appears to be secondary to dehydration and prerenal azotemia, UTI and continue use of nephrotoxic agents  UTI --Urine culture with insignificant colonies growth  --will d/c IV antibiotics and consolidate treatment with ceftin (last dose 6/9) --no fever, no dysuria  Diarrhea --no further loose stools appreciated  --Patient uses chronically laxatives as part of his medication regimen and was recently started on amoxicillin for UTI --No abdominal pain, no nausea, no vomiting.   Bilirubin of 3 of unclear etiology --RUQ ultrasound demonstrating hepatic steatosis only --No icterus --Monitor LFT's intermittently   Bradycardia --SHEENT with heart rate as low and mid 40s to 50s, symptomatic, the blockers has been held, clean 60s and 70s range, resume Coreg at half dose, discharged on Coreg 12.5 mg twice a day  Atrial fibrillation --CHADsVASC score  4-5 --Prior to admission was considered not a candidate for anticoagulation (risk for falls) --Discharged on lower dose Coreg will decrease from 25 twice a day to 12.5 twice a day --per neurology rec's and given the fact he will be going to controlled environment (SNF) and the findings of moderate embolic stroke; will continue ASA and Plavix for now; then in 2 days plan is to start treatment with eliquis for secondary prevention , at that time ASA and plavix will be discontinued   DM --We will continue holding Januvia and metformin on discharge --Lantus dose has been adjusted as his CBGs been controlled during hospital stay(likely due to that compliance), will add insulin sliding scale on discharge --A1C 9.4 --continue carb modified diet   History of dementia with depression/behavioral issues --Continue aricept and namenda --continue supportive  care and continue Celexa/Seroquel  HTN --Continue amlodipine and valsartan    Discharge Condition:  stable   Follow UP  Follow-up Information    Follow up with Dennie Bible, NP In 2 months.   Specialty:  Family Medicine   Why:  stroke clinic   Contact information:   [address] [place] [address] [phone]       Follow up with [facility].   Specialty:  [place] information:   [address] [place] [phone]        Discharge Instructions  and  Discharge Medications         Discharge Instructions    Ambulatory referral to Neurology    Complete by:  As directed   Follow up with Cecille Rubin, NP, at [facility] in about 2 months. Thanks.     Discharge instructions    Complete by:  As directed   Follow with SNF physician.  Get CBC, CMP,  checked  by Primary MD next visit.    Activity: As tolerated with Full fall precautions use walker/cane & assistance as needed   Disposition SNF   Diet: Heart Healthy ,  carbohydrate modified with thin Liquid , with feeding assistance and aspiration precautions.  For Heart failure patients - Check your Weight same time everyday, if you gain over 2 pounds, or you develop in leg swelling, experience more shortness of breath or chest pain, call your Primary MD immediately. Follow Cardiac Low Salt Diet and 1.5 lit/day fluid restriction.   On your next visit with your primary care physician please Get Medicines reviewed and adjusted.   Please request your Prim.MD to go over all Hospital Tests and Procedure/Radiological results at the follow up, please get all Hospital records sent to your Prim MD by signing hospital release before you go home.   If you experience worsening of your admission symptoms, develop shortness of breath, life threatening emergency, suicidal or homicidal thoughts you must seek medical attention immediately by calling 911 or calling your MD immediately  if symptoms less severe.  You Must read complete instructions/literature along with all the possible adverse reactions/side effects for all the Medicines you take and that have been prescribed to you. Take any new Medicines after you have completely understood and accpet all the possible adverse reactions/side effects.   Do not drive, operating heavy machinery, perform activities at heights, swimming or participation in water activities or provide baby sitting services if your were admitted for syncope or siezures until you have seen by Primary MD or a Neurologist and advised to do so again.  Do not drive when taking Pain medications.    Do not take more than prescribed Pain, Sleep and Anxiety Medications  Special Instructions: If you have smoked or chewed Tobacco  in the last 2 yrs please stop smoking, stop any regular Alcohol  and or any Recreational drug use.  Wear Seat belts while driving.   Please note  You were cared for by a hospitalist during your hospital stay. If you have any  questions about your discharge medications or the care you received while you were in the hospital after you are discharged, you can call the unit and asked to speak with the hospitalist on call if the hospitalist that took care of you is not available. Once you are discharged, your primary care physician will handle any further medical issues. Please note that NO REFILLS for any discharge  medications will be authorized once you are discharged, as it is imperative that you return to your primary care physician (or establish a relationship with a primary care physician if you do not have one) for your aftercare needs so that they can reassess your need for medications and monitor your lab values.     Increase activity slowly    Complete by:  As directed             Medication List    STOP taking these medications        acetaminophen 500 MG tablet  Commonly known as:  TYLENOL     amoxicillin 500 MG capsule  Commonly known as:  AMOXIL     metFORMIN 1000 MG tablet  Commonly known as:  GLUCOPHAGE     sitaGLIPtin 100 MG tablet  Commonly known as:  JANUVIA     TRIPLE ANTIBIOTIC [phone] Oint      TAKE these medications        amLODipine 5 MG tablet  Commonly known as:  NORVASC  Take 5 mg by mouth daily.     ammonium lactate 12 % lotion  Commonly known as:  LAC-HYDRIN  Apply 1 application topically 2 (two) times daily. Apply to arms, legs and trunk     apixaban 5 MG Tabs tablet  Commonly known as:  ELIQUIS  Take 1 tablet (5 mg total) by mouth 2 (two) times daily. Please start on 06/13/2015  Start taking on:  06/13/2015     aspirin 81 MG chewable tablet  Chew 1 tablet (81 mg total) by mouth daily. take for next 2 days with last dose on 6/9, as will be started on Eliquis after that     atorvastatin 10 MG tablet  Commonly known as:  LIPITOR  Take 1 tablet (10 mg total) by mouth daily.     carvedilol 12.5 MG tablet  Commonly known as:  COREG  Take 1 tablet (12.5 mg total) by  mouth 2 (two) times daily with a meal.     cefUROXime 250 MG tablet  Commonly known as:  CEFTIN  Take 1 tablet (250 mg total) by mouth 2 (two) times daily with a meal. Last dose on 6/9     citalopram 20 MG tablet  Commonly known as:  CELEXA  Take 20 mg by mouth daily.     clopidogrel 75 MG tablet  Commonly known as:  PLAVIX  Take 1 tablet (75 mg total) by mouth daily. take for next 2 days with last dose on 6/9, as will be started on Eliquis after that     DIABETIC TUSSIN EX 100 MG/5ML liquid  Generic drug:  guaiFENesin  Take 200 mg by mouth every 6 (six) hours as needed for cough.     docusate sodium 100 MG capsule  Commonly known as:  COLACE  Take 100 mg by mouth 2 (two) times daily as needed for mild constipation.     donepezil 10 MG tablet  Commonly known as:  ARICEPT  Take 10 mg by mouth at bedtime.     furosemide 20 MG tablet  Commonly known as:  LASIX  Take 20 mg by mouth daily.     Hydrocortisone-Aloe Vera 0.5 % Crea  Apply 1 application topically every 6 (six) hours as needed (itching/ rash).     insulin aspart 100 UNIT/ML injection  Commonly known as:  novoLOG  Inject 0-9 Units into the skin 3 (three) times daily with meals.  insulin glargine 100 UNIT/ML injection  Commonly known as:  LANTUS  Inject 0.12 mLs (12 Units total) into the skin at bedtime.     loperamide 2 MG tablet  Commonly known as:  IMODIUM A-D  Take 2 mg by mouth 4 (four) times daily as needed for diarrhea or loose stools.     magnesium hydroxide 400 MG/5ML suspension  Commonly known as:  MILK OF MAGNESIA  Take 30 mLs by mouth daily as needed (constipation).     multivitamin with minerals Tabs tablet  Take 1 tablet by mouth daily.     multivitamin-lutein Caps capsule  Take 1 capsule by mouth daily.     MYLANTA PO  Take 30 mLs by mouth 4 (four) times daily as needed (heartburn/ indigestion).     NAMENDA XR 28 MG Cp24 24 hr capsule  Generic drug:  memantine  Take 28 mg by mouth  daily.     QUEtiapine 25 MG tablet  Commonly known as:  SEROQUEL  Take 25 mg by mouth at bedtime.     REGULOID PO  Take 5 mLs by mouth daily. Mix in 4 oz of suitable liquid and drink     valsartan 40 MG tablet  Commonly known as:  DIOVAN  Take 40 mg by mouth daily.     Vitamin B-12 1000 MCG Subl  Place 1,000 mcg under the tongue 2 (two) times daily.     Vitamin D3 5000 units Caps  Take 5,000 Units by mouth daily.          Diet and Activity recommendation: See Discharge Instructions above   Consults obtained - Neurology   Major procedures and Radiology Reports - PLEASE review detailed and final reports for all details, in brief -      Ct Head Wo Contrast  06/07/2015  CLINICAL DATA:  Increasing lethargy for 12 hours. Fall today. Recent urinary tract infection. EXAM: CT HEAD WITHOUT CONTRAST TECHNIQUE: Contiguous axial images were obtained from the base of the skull through the vertex without intravenous contrast. COMPARISON:  None. FINDINGS: Brain: There is no evidence of acute intracranial hemorrhage, mass lesion or acute extra-axial fluid collection. There is a possible arachnoid cyst in the left middle cranial fossa. The ventricles and subarachnoid spaces are diffusely prominent, consistent with atrophy. There is ill-defined low-density in the left parietal occipital lobes suspicious for a subacute stroke in the left PCA distribution. There are mild chronic small vessel ischemic changes in the periventricular white matter. There is no hydrocephalus. Intracranial vascular calcifications are present. Bones/sinuses/visualized face: There is mucosal thickening in the maxillary and ethmoid sinuses bilaterally with a possible right maxillary sinus air-fluid level. The visualized paranasal sinuses, mastoid air cells and middle ears are otherwise clear. The calvarium is intact. IMPRESSION: 1. Ill-defined low-density in the left parietal occipital lobe suspicious for a subacute  nonhemorrhagic stroke in the PCA distribution. 2. Atrophy and mild chronic small vessel ischemic changes. 3. Paranasal sinus mucosal thickening with possible right maxillary sinus air-fluid level. Electronically Signed   By: Richardean Sale M.D.   On: 06/07/2015 20:49   Mr Brain Wo Contrast  06/08/2015  CLINICAL DATA:  82 year old diabetic hypertensive male with dementia presenting with 2-3 gradual decline. Recurrent falls. Subsequent encounter. EXAM: MRI HEAD WITHOUT CONTRAST MRA HEAD WITHOUT CONTRAST TECHNIQUE: Multiplanar, multiecho pulse sequences of the brain and surrounding structures were obtained without intravenous contrast. Angiographic images of the head were obtained using MRA technique without contrast. COMPARISON:  06/06/2005 CT.  10/02/2007 MR. FINDINGS: MRI HEAD FINDINGS Acute left parietal-occipital lobe nonhemorrhagic infarct. Acute/subacute nonhemorrhagic left temporal - occipital lobe infarct. Acute/subacute small nonhemorrhagic left thalamic infarct. Acute/ subacute right temporal-occipital lobe nonhemorrhagic infarct. With involvement of the occipital lobes bilaterally, one could question posterior reversible encephalopathy syndrome however this is felt to be less likely consideration than infarction. Global moderate atrophy without hydrocephalus. No intracranial mass lesion noted on this unenhanced exam. Cervical spondylotic changes with spinal stenosis and slight cord flattening C3-4 level. Cervical medullary junction unremarkable. Prominent polypoid opacification maxillary sinuses measuring up to 9 mm. Air-fluid level right maxillary sinus suggesting acute sinusitis. Mucosal thickening ethmoid sinus air cells. Partial opacification mastoid air cells without obstructing lesion of eustachian tube noted. MRA HEAD FINDINGS Moderate to marked tandem stenosis throughout the right posterior cerebral artery. Mild to moderate narrowing proximal left posterior cerebral artery with marked  narrowing mid to posterior left posterior cerebral artery. Moderate narrowing superior cerebellar artery bilaterally. Mild narrowing basilar artery without high-grade stenosis. Tiny bulge left lateral aspect probably origin of the left anterior inferior cerebellar artery (which is poorly delineated) rather than aneurysm. Nonvisualized right anterior inferior cerebellar artery. Mild narrowing distal vertebral arteries bilaterally. Moderate to marked narrowing of portions of the posterior inferior cerebellar artery bilaterally. Moderate narrowing right internal carotid artery cavernous segment. Moderate to marked focal narrowing left internal carotid artery cavernous/ supraclinoid junction. Prominence of the origin of the ophthalmic artery bilaterally. Tiny aneurysms possible although I suspect this may represent ectasia of the origin of the ophthalmic artery rather than aneurysm. Mildly hypoplastic A1 segment right anterior cerebral artery. Moderate focal stenosis distal M1 segment right middle cerebral artery. Mild to moderate narrowing middle cerebral artery branches greater on the right. IMPRESSION: MRI HEAD Acute left parietal-occipital lobe nonhemorrhagic infarct. Acute/subacute nonhemorrhagic left temporal - occipital lobe infarct. Acute/subacute small nonhemorrhagic left thalamic infarct. Acute/ subacute right temporal-occipital lobe nonhemorrhagic infarct. Global moderate atrophy without hydrocephalus. Prominent polypoid opacification maxillary sinuses measuring up to 9 mm. Air-fluid level right maxillary sinus suggesting acute sinusitis. Mucosal thickening ethmoid sinus air cells. Partial opacification mastoid air cells. MRA HEAD FINDINGS Moderate to marked tandem stenosis throughout the right posterior cerebral artery. Mild to moderate narrowing proximal left posterior cerebral artery with marked narrowing mid to posterior left posterior cerebral artery. Moderate narrowing superior cerebellar artery  bilaterally. Mild narrowing basilar artery without high-grade stenosis. Tiny bulge left lateral aspect probably origin of the left anterior inferior cerebellar artery (which is poorly delineated) rather than aneurysm. Nonvisualized right anterior inferior cerebellar artery. Moderate to marked narrowing of portions of the posterior inferior cerebellar artery bilaterally. Moderate narrowing right internal carotid artery cavernous segment. Moderate to marked focal narrowing left internal carotid artery cavernous/ supraclinoid junction. Prominence of the origin of the ophthalmic artery bilaterally. Tiny aneurysms possible although I suspect this may represent ectasia of the origin of the ophthalmic artery rather than aneurysm. Moderate focal stenosis distal M1 segment right middle cerebral artery. Mild to moderate narrowing middle cerebral artery branches greater on the right. Electronically Signed   By: Genia Del M.D.   On: 06/08/2015 07:21   Dg Chest Port 1 View  06/07/2015  CLINICAL DATA:  Acute mental status change EXAM: PORTABLE CHEST 1 VIEW COMPARISON:  None. FINDINGS: The heart size and mediastinal contours are within normal limits. Both lungs are clear. The visualized skeletal structures are unremarkable. IMPRESSION: No active disease. Electronically Signed   By: Dorise Bullion III M.D   On: 06/07/2015 21:27   Mr  Mra Head/brain Wo Cm  06/08/2015  CLINICAL DATA:  82 year old diabetic hypertensive male with dementia presenting with 2-3 gradual decline. Recurrent falls. Subsequent encounter. EXAM: MRI HEAD WITHOUT CONTRAST MRA HEAD WITHOUT CONTRAST TECHNIQUE: Multiplanar, multiecho pulse sequences of the brain and surrounding structures were obtained without intravenous contrast. Angiographic images of the head were obtained using MRA technique without contrast. COMPARISON:  06/06/2005 CT.  10/02/2007 MR. FINDINGS: MRI HEAD FINDINGS Acute left parietal-occipital lobe nonhemorrhagic infarct.  Acute/subacute nonhemorrhagic left temporal - occipital lobe infarct. Acute/subacute small nonhemorrhagic left thalamic infarct. Acute/ subacute right temporal-occipital lobe nonhemorrhagic infarct. With involvement of the occipital lobes bilaterally, one could question posterior reversible encephalopathy syndrome however this is felt to be less likely consideration than infarction. Global moderate atrophy without hydrocephalus. No intracranial mass lesion noted on this unenhanced exam. Cervical spondylotic changes with spinal stenosis and slight cord flattening C3-4 level. Cervical medullary junction unremarkable. Prominent polypoid opacification maxillary sinuses measuring up to 9 mm. Air-fluid level right maxillary sinus suggesting acute sinusitis. Mucosal thickening ethmoid sinus air cells. Partial opacification mastoid air cells without obstructing lesion of eustachian tube noted. MRA HEAD FINDINGS Moderate to marked tandem stenosis throughout the right posterior cerebral artery. Mild to moderate narrowing proximal left posterior cerebral artery with marked narrowing mid to posterior left posterior cerebral artery. Moderate narrowing superior cerebellar artery bilaterally. Mild narrowing basilar artery without high-grade stenosis. Tiny bulge left lateral aspect probably origin of the left anterior inferior cerebellar artery (which is poorly delineated) rather than aneurysm. Nonvisualized right anterior inferior cerebellar artery. Mild narrowing distal vertebral arteries bilaterally. Moderate to marked narrowing of portions of the posterior inferior cerebellar artery bilaterally. Moderate narrowing right internal carotid artery cavernous segment. Moderate to marked focal narrowing left internal carotid artery cavernous/ supraclinoid junction. Prominence of the origin of the ophthalmic artery bilaterally. Tiny aneurysms possible although I suspect this may represent ectasia of the origin of the ophthalmic artery  rather than aneurysm. Mildly hypoplastic A1 segment right anterior cerebral artery. Moderate focal stenosis distal M1 segment right middle cerebral artery. Mild to moderate narrowing middle cerebral artery branches greater on the right. IMPRESSION: MRI HEAD Acute left parietal-occipital lobe nonhemorrhagic infarct. Acute/subacute nonhemorrhagic left temporal - occipital lobe infarct. Acute/subacute small nonhemorrhagic left thalamic infarct. Acute/ subacute right temporal-occipital lobe nonhemorrhagic infarct. Global moderate atrophy without hydrocephalus. Prominent polypoid opacification maxillary sinuses measuring up to 9 mm. Air-fluid level right maxillary sinus suggesting acute sinusitis. Mucosal thickening ethmoid sinus air cells. Partial opacification mastoid air cells. MRA HEAD FINDINGS Moderate to marked tandem stenosis throughout the right posterior cerebral artery. Mild to moderate narrowing proximal left posterior cerebral artery with marked narrowing mid to posterior left posterior cerebral artery. Moderate narrowing superior cerebellar artery bilaterally. Mild narrowing basilar artery without high-grade stenosis. Tiny bulge left lateral aspect probably origin of the left anterior inferior cerebellar artery (which is poorly delineated) rather than aneurysm. Nonvisualized right anterior inferior cerebellar artery. Moderate to marked narrowing of portions of the posterior inferior cerebellar artery bilaterally. Moderate narrowing right internal carotid artery cavernous segment. Moderate to marked focal narrowing left internal carotid artery cavernous/ supraclinoid junction. Prominence of the origin of the ophthalmic artery bilaterally. Tiny aneurysms possible although I suspect this may represent ectasia of the origin of the ophthalmic artery rather than aneurysm. Moderate focal stenosis distal M1 segment right middle cerebral artery. Mild to moderate narrowing middle cerebral artery branches greater on  the right. Electronically Signed   By: Genia Del M.D.   On:  06/08/2015 07:21   US Abdomen Limited Ruq  06/08/2015  CLINICAL DATA:  Abnormal liver function tests.  Cholecystectomy. EXAM: US ABDOMEN LIMITED - RIGHT UPPER QUADRANT COMPARISON:  CT 05/23/2015 FINDINGS: Gallbladder: Surgically absent Common bile duct: Diameter: Upper limits of normal at 6 mm. Liver: Liver is diffusely increased in echogenicity. No focal lesion or duct dilatation. IMPRESSION: Increased liver echogenicity commonly represents hepatic steatosis. Electronically Signed   By: Suzy Bouchard M.D.   On: 06/08/2015 09:33    Micro Results     Recent Results (from the past 240 hour(s))  Urine culture     Status: Abnormal   Collection Time: 06/07/15  7:30 PM  Result Value Ref Range Status   Specimen Description URINE, CATHETERIZED  Final   Special Requests unknown Normal  Final   Culture <10,000 COLONIES/mL INSIGNIFICANT GROWTH (A)  Final   Report Status 06/08/2015 FINAL  Final       Today   Subjective:   Jary Cleary today has no headache,no chest or abdominal pain, feels much better today. Objective:   Blood pressure 149/62, pulse 66, temperature 98.4 F (36.9 C), temperature source Oral, resp. rate 17, height 5\' 9"  (1.753 m), weight 90.3 kg (199 lb 1.2 oz), SpO2 96 %.  No intake or output data in the 24 hours ending 06/10/15 1317  Exam  General: Afebrile, no CP, no SOB, oriented X1. In no distress. Pleasantly confused. Poor insight on exam.   Cardiovascular: rate controlled, no rubs or gallops   Respiratory: no wheezing, no crackles  Abdomen: soft, NT, ND, positive BS  Musculoskeletal: no cyanosis or clubbing   Neuro: moving 4 limbs, no focal motor deficit; CN grossly intact  Data Review   CBC w Diff:  Lab Results  Component Value Date   WBC 13.5* 06/07/2015   HGB 12.7* 06/07/2015   HCT 39.0 06/07/2015   PLT 317 06/07/2015   LYMPHOPCT 17 06/07/2015   MONOPCT 8 06/07/2015   EOSPCT  2 06/07/2015   BASOPCT 0 06/07/2015    CMP:  Lab Results  Component Value Date   NA 137 06/10/2015   K 4.1 06/10/2015   CL 101 06/10/2015   CO2 25 06/10/2015   BUN 6 06/10/2015   CREATININE 1.03 06/10/2015   PROT 6.0* 06/08/2015   ALBUMIN 2.8* 06/08/2015   BILITOT 1.8* 06/08/2015   ALKPHOS 57 06/08/2015   AST 25 06/08/2015   ALT 14* 06/08/2015  .   Total Time in preparing paper work, data evaluation and todays exam - 35 minutes  Ali Mohl M.D on 06/10/2015 at 1:17 PM  [facility]   Office  [phone]

## 2015-06-10 NOTE — Discharge Instructions (Signed)
Follow with SNF physician.  Get CBC, CMP,  checked  by Primary MD next visit.    Activity: As tolerated with Full fall precautions use walker/cane & assistance as needed   Disposition SNF   Diet: Heart Healthy , carbohydrate modified with thin Liquid , with feeding assistance and aspiration precautions.  For Heart failure patients - Check your Weight same time everyday, if you gain over 2 pounds, or you develop in leg swelling, experience more shortness of breath or chest pain, call your Primary MD immediately. Follow Cardiac Low Salt Diet and 1.5 lit/day fluid restriction.   On your next visit with your primary care physician please Get Medicines reviewed and adjusted.   Please request your Prim.MD to go over all Hospital Tests and Procedure/Radiological results at the follow up, please get all Hospital records sent to your Prim MD by signing hospital release before you go home.   If you experience worsening of your admission symptoms, develop shortness of breath, life threatening emergency, suicidal or homicidal thoughts you must seek medical attention immediately by calling 911 or calling your MD immediately  if symptoms less severe.  You Must read complete instructions/literature along with all the possible adverse reactions/side effects for all the Medicines you take and that have been prescribed to you. Take any new Medicines after you have completely understood and accpet all the possible adverse reactions/side effects.   Do not drive, operating heavy machinery, perform activities at heights, swimming or participation in water activities or provide baby sitting services if your were admitted for syncope or siezures until you have seen by Primary MD or a Neurologist and advised to do so again.  Do not drive when taking Pain medications.    Do not take more than prescribed Pain, Sleep and Anxiety Medications  Special Instructions: If you have smoked or chewed Tobacco  in the  last 2 yrs please stop smoking, stop any regular Alcohol  and or any Recreational drug use.  Wear Seat belts while driving.   Please note  You were cared for by a hospitalist during your hospital stay. If you have any questions about your discharge medications or the care you received while you were in the hospital after you are discharged, you can call the unit and asked to speak with the hospitalist on call if the hospitalist that took care of you is not available. Once you are discharged, your primary care physician will handle any further medical issues. Please note that NO REFILLS for any discharge medications will be authorized once you are discharged, as it is imperative that you return to your primary care physician (or establish a relationship with a primary care physician if you do not have one) for your aftercare needs so that they can reassess your need for medications and monitor your lab values.

## 2015-06-17 DIAGNOSIS — E119 Type 2 diabetes mellitus without complications: Secondary | ICD-10-CM | POA: Diagnosis not present

## 2015-06-17 DIAGNOSIS — I509 Heart failure, unspecified: Secondary | ICD-10-CM | POA: Diagnosis not present

## 2015-06-17 DIAGNOSIS — E875 Hyperkalemia: Secondary | ICD-10-CM | POA: Diagnosis not present

## 2015-06-25 DIAGNOSIS — R54 Age-related physical debility: Secondary | ICD-10-CM | POA: Diagnosis not present

## 2015-06-25 DIAGNOSIS — F0391 Unspecified dementia with behavioral disturbance: Secondary | ICD-10-CM | POA: Diagnosis not present

## 2015-06-30 DIAGNOSIS — F321 Major depressive disorder, single episode, moderate: Secondary | ICD-10-CM | POA: Diagnosis not present

## 2015-06-30 DIAGNOSIS — F0151 Vascular dementia with behavioral disturbance: Secondary | ICD-10-CM | POA: Diagnosis not present

## 2015-06-30 DIAGNOSIS — F0281 Dementia in other diseases classified elsewhere with behavioral disturbance: Secondary | ICD-10-CM | POA: Diagnosis not present

## 2015-07-01 DIAGNOSIS — R9431 Abnormal electrocardiogram [ECG] [EKG]: Secondary | ICD-10-CM | POA: Diagnosis not present

## 2015-07-02 DIAGNOSIS — W19XXXA Unspecified fall, initial encounter: Secondary | ICD-10-CM | POA: Diagnosis not present

## 2015-07-02 DIAGNOSIS — R634 Abnormal weight loss: Secondary | ICD-10-CM | POA: Diagnosis not present

## 2015-07-02 DIAGNOSIS — I509 Heart failure, unspecified: Secondary | ICD-10-CM | POA: Diagnosis not present

## 2015-07-02 DIAGNOSIS — R269 Unspecified abnormalities of gait and mobility: Secondary | ICD-10-CM | POA: Diagnosis not present

## 2015-07-02 DIAGNOSIS — M6281 Muscle weakness (generalized): Secondary | ICD-10-CM | POA: Diagnosis not present

## 2015-07-05 DIAGNOSIS — Z79899 Other long term (current) drug therapy: Secondary | ICD-10-CM | POA: Diagnosis not present

## 2015-07-05 DIAGNOSIS — R5381 Other malaise: Secondary | ICD-10-CM | POA: Diagnosis not present

## 2015-07-05 DIAGNOSIS — E875 Hyperkalemia: Secondary | ICD-10-CM | POA: Diagnosis not present

## 2015-07-05 DIAGNOSIS — E785 Hyperlipidemia, unspecified: Secondary | ICD-10-CM | POA: Diagnosis not present

## 2015-07-05 DIAGNOSIS — E889 Metabolic disorder, unspecified: Secondary | ICD-10-CM | POA: Diagnosis not present

## 2015-07-05 DIAGNOSIS — I4891 Unspecified atrial fibrillation: Secondary | ICD-10-CM | POA: Diagnosis not present

## 2015-07-05 DIAGNOSIS — I10 Essential (primary) hypertension: Secondary | ICD-10-CM | POA: Diagnosis not present

## 2015-07-05 DIAGNOSIS — E119 Type 2 diabetes mellitus without complications: Secondary | ICD-10-CM | POA: Diagnosis not present

## 2015-07-09 DIAGNOSIS — E1165 Type 2 diabetes mellitus with hyperglycemia: Secondary | ICD-10-CM | POA: Diagnosis not present

## 2015-07-09 DIAGNOSIS — R54 Age-related physical debility: Secondary | ICD-10-CM | POA: Diagnosis not present

## 2015-07-16 DIAGNOSIS — E119 Type 2 diabetes mellitus without complications: Secondary | ICD-10-CM | POA: Diagnosis not present

## 2015-07-16 DIAGNOSIS — I10 Essential (primary) hypertension: Secondary | ICD-10-CM | POA: Diagnosis not present

## 2015-07-16 DIAGNOSIS — F0391 Unspecified dementia with behavioral disturbance: Secondary | ICD-10-CM | POA: Diagnosis not present

## 2015-07-16 DIAGNOSIS — I4891 Unspecified atrial fibrillation: Secondary | ICD-10-CM | POA: Diagnosis not present

## 2015-08-06 DIAGNOSIS — I4891 Unspecified atrial fibrillation: Secondary | ICD-10-CM | POA: Diagnosis not present

## 2015-08-06 DIAGNOSIS — I10 Essential (primary) hypertension: Secondary | ICD-10-CM | POA: Diagnosis not present

## 2015-08-06 DIAGNOSIS — F0391 Unspecified dementia with behavioral disturbance: Secondary | ICD-10-CM | POA: Diagnosis not present

## 2015-08-06 DIAGNOSIS — E119 Type 2 diabetes mellitus without complications: Secondary | ICD-10-CM | POA: Diagnosis not present

## 2015-08-25 ENCOUNTER — Ambulatory Visit: Payer: Medicare Other | Admitting: Nurse Practitioner

## 2015-09-24 DIAGNOSIS — Z794 Long term (current) use of insulin: Secondary | ICD-10-CM | POA: Diagnosis not present

## 2015-09-24 DIAGNOSIS — R54 Age-related physical debility: Secondary | ICD-10-CM | POA: Diagnosis not present

## 2015-09-24 DIAGNOSIS — E119 Type 2 diabetes mellitus without complications: Secondary | ICD-10-CM | POA: Diagnosis not present

## 2015-09-29 DIAGNOSIS — F0151 Vascular dementia with behavioral disturbance: Secondary | ICD-10-CM | POA: Diagnosis not present

## 2015-09-29 DIAGNOSIS — F321 Major depressive disorder, single episode, moderate: Secondary | ICD-10-CM | POA: Diagnosis not present

## 2015-09-29 DIAGNOSIS — F0281 Dementia in other diseases classified elsewhere with behavioral disturbance: Secondary | ICD-10-CM | POA: Diagnosis not present

## 2015-10-01 DIAGNOSIS — E119 Type 2 diabetes mellitus without complications: Secondary | ICD-10-CM | POA: Diagnosis not present

## 2015-10-01 DIAGNOSIS — I10 Essential (primary) hypertension: Secondary | ICD-10-CM | POA: Diagnosis not present

## 2015-10-01 DIAGNOSIS — F0391 Unspecified dementia with behavioral disturbance: Secondary | ICD-10-CM | POA: Diagnosis not present

## 2015-10-01 DIAGNOSIS — I4891 Unspecified atrial fibrillation: Secondary | ICD-10-CM | POA: Diagnosis not present

## 2015-10-22 DIAGNOSIS — E538 Deficiency of other specified B group vitamins: Secondary | ICD-10-CM | POA: Diagnosis not present

## 2015-11-05 DIAGNOSIS — R54 Age-related physical debility: Secondary | ICD-10-CM | POA: Diagnosis not present

## 2015-11-05 DIAGNOSIS — R600 Localized edema: Secondary | ICD-10-CM | POA: Diagnosis not present

## 2015-11-05 DIAGNOSIS — R269 Unspecified abnormalities of gait and mobility: Secondary | ICD-10-CM | POA: Diagnosis not present

## 2015-11-05 DIAGNOSIS — S80812A Abrasion, left lower leg, initial encounter: Secondary | ICD-10-CM | POA: Diagnosis not present

## 2015-11-11 DIAGNOSIS — F0151 Vascular dementia with behavioral disturbance: Secondary | ICD-10-CM | POA: Diagnosis not present

## 2015-11-11 DIAGNOSIS — F0281 Dementia in other diseases classified elsewhere with behavioral disturbance: Secondary | ICD-10-CM | POA: Diagnosis not present

## 2015-11-11 DIAGNOSIS — F321 Major depressive disorder, single episode, moderate: Secondary | ICD-10-CM | POA: Diagnosis not present

## 2015-11-17 DIAGNOSIS — H353132 Nonexudative age-related macular degeneration, bilateral, intermediate dry stage: Secondary | ICD-10-CM | POA: Diagnosis not present

## 2015-11-17 DIAGNOSIS — E119 Type 2 diabetes mellitus without complications: Secondary | ICD-10-CM | POA: Diagnosis not present

## 2015-11-17 DIAGNOSIS — Z794 Long term (current) use of insulin: Secondary | ICD-10-CM | POA: Diagnosis not present

## 2015-11-17 DIAGNOSIS — H2513 Age-related nuclear cataract, bilateral: Secondary | ICD-10-CM | POA: Diagnosis not present

## 2015-12-03 DIAGNOSIS — F0391 Unspecified dementia with behavioral disturbance: Secondary | ICD-10-CM | POA: Diagnosis not present

## 2015-12-03 DIAGNOSIS — I4891 Unspecified atrial fibrillation: Secondary | ICD-10-CM | POA: Diagnosis not present

## 2015-12-03 DIAGNOSIS — E119 Type 2 diabetes mellitus without complications: Secondary | ICD-10-CM | POA: Diagnosis not present

## 2015-12-03 DIAGNOSIS — I10 Essential (primary) hypertension: Secondary | ICD-10-CM | POA: Diagnosis not present

## 2015-12-17 DIAGNOSIS — Z79899 Other long term (current) drug therapy: Secondary | ICD-10-CM | POA: Diagnosis not present

## 2015-12-18 DIAGNOSIS — F0151 Vascular dementia with behavioral disturbance: Secondary | ICD-10-CM | POA: Diagnosis not present

## 2015-12-18 DIAGNOSIS — F321 Major depressive disorder, single episode, moderate: Secondary | ICD-10-CM | POA: Diagnosis not present

## 2015-12-18 DIAGNOSIS — F0281 Dementia in other diseases classified elsewhere with behavioral disturbance: Secondary | ICD-10-CM | POA: Diagnosis not present

## 2015-12-24 DIAGNOSIS — R54 Age-related physical debility: Secondary | ICD-10-CM | POA: Diagnosis not present

## 2015-12-24 DIAGNOSIS — E119 Type 2 diabetes mellitus without complications: Secondary | ICD-10-CM | POA: Diagnosis not present

## 2015-12-24 DIAGNOSIS — I509 Heart failure, unspecified: Secondary | ICD-10-CM | POA: Diagnosis not present

## 2015-12-24 DIAGNOSIS — R634 Abnormal weight loss: Secondary | ICD-10-CM | POA: Diagnosis not present

## 2016-01-27 DIAGNOSIS — Z79899 Other long term (current) drug therapy: Secondary | ICD-10-CM | POA: Diagnosis not present

## 2016-01-28 DIAGNOSIS — I4891 Unspecified atrial fibrillation: Secondary | ICD-10-CM | POA: Diagnosis not present

## 2016-01-28 DIAGNOSIS — I10 Essential (primary) hypertension: Secondary | ICD-10-CM | POA: Diagnosis not present

## 2016-01-28 DIAGNOSIS — I509 Heart failure, unspecified: Secondary | ICD-10-CM | POA: Diagnosis not present

## 2016-01-28 DIAGNOSIS — F0391 Unspecified dementia with behavioral disturbance: Secondary | ICD-10-CM | POA: Diagnosis not present

## 2016-02-03 DIAGNOSIS — M79674 Pain in right toe(s): Secondary | ICD-10-CM | POA: Diagnosis not present

## 2016-02-03 DIAGNOSIS — B351 Tinea unguium: Secondary | ICD-10-CM | POA: Diagnosis not present

## 2016-02-03 DIAGNOSIS — M79675 Pain in left toe(s): Secondary | ICD-10-CM | POA: Diagnosis not present

## 2016-02-11 DIAGNOSIS — I69359 Hemiplegia and hemiparesis following cerebral infarction affecting unspecified side: Secondary | ICD-10-CM | POA: Diagnosis not present

## 2016-02-11 DIAGNOSIS — M6281 Muscle weakness (generalized): Secondary | ICD-10-CM | POA: Diagnosis not present

## 2016-02-11 DIAGNOSIS — R2689 Other abnormalities of gait and mobility: Secondary | ICD-10-CM | POA: Diagnosis not present

## 2016-02-11 DIAGNOSIS — F0391 Unspecified dementia with behavioral disturbance: Secondary | ICD-10-CM | POA: Diagnosis not present

## 2016-02-11 DIAGNOSIS — R296 Repeated falls: Secondary | ICD-10-CM | POA: Diagnosis not present

## 2016-02-11 DIAGNOSIS — R278 Other lack of coordination: Secondary | ICD-10-CM | POA: Diagnosis not present

## 2016-02-12 DIAGNOSIS — R296 Repeated falls: Secondary | ICD-10-CM | POA: Diagnosis not present

## 2016-02-12 DIAGNOSIS — R278 Other lack of coordination: Secondary | ICD-10-CM | POA: Diagnosis not present

## 2016-02-12 DIAGNOSIS — I69359 Hemiplegia and hemiparesis following cerebral infarction affecting unspecified side: Secondary | ICD-10-CM | POA: Diagnosis not present

## 2016-02-12 DIAGNOSIS — M6281 Muscle weakness (generalized): Secondary | ICD-10-CM | POA: Diagnosis not present

## 2016-02-12 DIAGNOSIS — F0391 Unspecified dementia with behavioral disturbance: Secondary | ICD-10-CM | POA: Diagnosis not present

## 2016-02-12 DIAGNOSIS — R2689 Other abnormalities of gait and mobility: Secondary | ICD-10-CM | POA: Diagnosis not present

## 2016-02-15 DIAGNOSIS — F0391 Unspecified dementia with behavioral disturbance: Secondary | ICD-10-CM | POA: Diagnosis not present

## 2016-02-15 DIAGNOSIS — R296 Repeated falls: Secondary | ICD-10-CM | POA: Diagnosis not present

## 2016-02-15 DIAGNOSIS — R278 Other lack of coordination: Secondary | ICD-10-CM | POA: Diagnosis not present

## 2016-02-15 DIAGNOSIS — M6281 Muscle weakness (generalized): Secondary | ICD-10-CM | POA: Diagnosis not present

## 2016-02-15 DIAGNOSIS — I69359 Hemiplegia and hemiparesis following cerebral infarction affecting unspecified side: Secondary | ICD-10-CM | POA: Diagnosis not present

## 2016-02-15 DIAGNOSIS — R2689 Other abnormalities of gait and mobility: Secondary | ICD-10-CM | POA: Diagnosis not present

## 2016-02-16 DIAGNOSIS — M6281 Muscle weakness (generalized): Secondary | ICD-10-CM | POA: Diagnosis not present

## 2016-02-16 DIAGNOSIS — F0391 Unspecified dementia with behavioral disturbance: Secondary | ICD-10-CM | POA: Diagnosis not present

## 2016-02-16 DIAGNOSIS — R2689 Other abnormalities of gait and mobility: Secondary | ICD-10-CM | POA: Diagnosis not present

## 2016-02-16 DIAGNOSIS — I69359 Hemiplegia and hemiparesis following cerebral infarction affecting unspecified side: Secondary | ICD-10-CM | POA: Diagnosis not present

## 2016-02-16 DIAGNOSIS — R278 Other lack of coordination: Secondary | ICD-10-CM | POA: Diagnosis not present

## 2016-02-16 DIAGNOSIS — R296 Repeated falls: Secondary | ICD-10-CM | POA: Diagnosis not present

## 2016-02-17 DIAGNOSIS — I69359 Hemiplegia and hemiparesis following cerebral infarction affecting unspecified side: Secondary | ICD-10-CM | POA: Diagnosis not present

## 2016-02-17 DIAGNOSIS — M6281 Muscle weakness (generalized): Secondary | ICD-10-CM | POA: Diagnosis not present

## 2016-02-17 DIAGNOSIS — R2689 Other abnormalities of gait and mobility: Secondary | ICD-10-CM | POA: Diagnosis not present

## 2016-02-17 DIAGNOSIS — F0391 Unspecified dementia with behavioral disturbance: Secondary | ICD-10-CM | POA: Diagnosis not present

## 2016-02-17 DIAGNOSIS — R296 Repeated falls: Secondary | ICD-10-CM | POA: Diagnosis not present

## 2016-02-17 DIAGNOSIS — R278 Other lack of coordination: Secondary | ICD-10-CM | POA: Diagnosis not present

## 2016-02-18 DIAGNOSIS — M6281 Muscle weakness (generalized): Secondary | ICD-10-CM | POA: Diagnosis not present

## 2016-02-18 DIAGNOSIS — F0391 Unspecified dementia with behavioral disturbance: Secondary | ICD-10-CM | POA: Diagnosis not present

## 2016-02-18 DIAGNOSIS — R296 Repeated falls: Secondary | ICD-10-CM | POA: Diagnosis not present

## 2016-02-18 DIAGNOSIS — R2689 Other abnormalities of gait and mobility: Secondary | ICD-10-CM | POA: Diagnosis not present

## 2016-02-18 DIAGNOSIS — R278 Other lack of coordination: Secondary | ICD-10-CM | POA: Diagnosis not present

## 2016-02-18 DIAGNOSIS — I69359 Hemiplegia and hemiparesis following cerebral infarction affecting unspecified side: Secondary | ICD-10-CM | POA: Diagnosis not present

## 2016-02-19 DIAGNOSIS — I69359 Hemiplegia and hemiparesis following cerebral infarction affecting unspecified side: Secondary | ICD-10-CM | POA: Diagnosis not present

## 2016-02-19 DIAGNOSIS — F0391 Unspecified dementia with behavioral disturbance: Secondary | ICD-10-CM | POA: Diagnosis not present

## 2016-02-19 DIAGNOSIS — R2689 Other abnormalities of gait and mobility: Secondary | ICD-10-CM | POA: Diagnosis not present

## 2016-02-19 DIAGNOSIS — R296 Repeated falls: Secondary | ICD-10-CM | POA: Diagnosis not present

## 2016-02-19 DIAGNOSIS — R278 Other lack of coordination: Secondary | ICD-10-CM | POA: Diagnosis not present

## 2016-02-19 DIAGNOSIS — M6281 Muscle weakness (generalized): Secondary | ICD-10-CM | POA: Diagnosis not present

## 2016-02-22 DIAGNOSIS — R2689 Other abnormalities of gait and mobility: Secondary | ICD-10-CM | POA: Diagnosis not present

## 2016-02-22 DIAGNOSIS — R278 Other lack of coordination: Secondary | ICD-10-CM | POA: Diagnosis not present

## 2016-02-22 DIAGNOSIS — R296 Repeated falls: Secondary | ICD-10-CM | POA: Diagnosis not present

## 2016-02-22 DIAGNOSIS — M6281 Muscle weakness (generalized): Secondary | ICD-10-CM | POA: Diagnosis not present

## 2016-02-22 DIAGNOSIS — F0391 Unspecified dementia with behavioral disturbance: Secondary | ICD-10-CM | POA: Diagnosis not present

## 2016-02-22 DIAGNOSIS — I69359 Hemiplegia and hemiparesis following cerebral infarction affecting unspecified side: Secondary | ICD-10-CM | POA: Diagnosis not present

## 2016-02-23 DIAGNOSIS — M6281 Muscle weakness (generalized): Secondary | ICD-10-CM | POA: Diagnosis not present

## 2016-02-23 DIAGNOSIS — R296 Repeated falls: Secondary | ICD-10-CM | POA: Diagnosis not present

## 2016-02-23 DIAGNOSIS — I69359 Hemiplegia and hemiparesis following cerebral infarction affecting unspecified side: Secondary | ICD-10-CM | POA: Diagnosis not present

## 2016-02-23 DIAGNOSIS — R2689 Other abnormalities of gait and mobility: Secondary | ICD-10-CM | POA: Diagnosis not present

## 2016-02-23 DIAGNOSIS — F0391 Unspecified dementia with behavioral disturbance: Secondary | ICD-10-CM | POA: Diagnosis not present

## 2016-02-23 DIAGNOSIS — R278 Other lack of coordination: Secondary | ICD-10-CM | POA: Diagnosis not present

## 2016-02-24 DIAGNOSIS — F0391 Unspecified dementia with behavioral disturbance: Secondary | ICD-10-CM | POA: Diagnosis not present

## 2016-02-24 DIAGNOSIS — R2689 Other abnormalities of gait and mobility: Secondary | ICD-10-CM | POA: Diagnosis not present

## 2016-02-24 DIAGNOSIS — M6281 Muscle weakness (generalized): Secondary | ICD-10-CM | POA: Diagnosis not present

## 2016-02-24 DIAGNOSIS — R278 Other lack of coordination: Secondary | ICD-10-CM | POA: Diagnosis not present

## 2016-02-24 DIAGNOSIS — R296 Repeated falls: Secondary | ICD-10-CM | POA: Diagnosis not present

## 2016-02-24 DIAGNOSIS — I69359 Hemiplegia and hemiparesis following cerebral infarction affecting unspecified side: Secondary | ICD-10-CM | POA: Diagnosis not present

## 2016-02-25 DIAGNOSIS — I69359 Hemiplegia and hemiparesis following cerebral infarction affecting unspecified side: Secondary | ICD-10-CM | POA: Diagnosis not present

## 2016-02-25 DIAGNOSIS — F0391 Unspecified dementia with behavioral disturbance: Secondary | ICD-10-CM | POA: Diagnosis not present

## 2016-02-25 DIAGNOSIS — M6281 Muscle weakness (generalized): Secondary | ICD-10-CM | POA: Diagnosis not present

## 2016-02-25 DIAGNOSIS — R2689 Other abnormalities of gait and mobility: Secondary | ICD-10-CM | POA: Diagnosis not present

## 2016-02-25 DIAGNOSIS — R296 Repeated falls: Secondary | ICD-10-CM | POA: Diagnosis not present

## 2016-02-25 DIAGNOSIS — R278 Other lack of coordination: Secondary | ICD-10-CM | POA: Diagnosis not present

## 2016-02-26 DIAGNOSIS — R2689 Other abnormalities of gait and mobility: Secondary | ICD-10-CM | POA: Diagnosis not present

## 2016-02-26 DIAGNOSIS — M6281 Muscle weakness (generalized): Secondary | ICD-10-CM | POA: Diagnosis not present

## 2016-02-26 DIAGNOSIS — R278 Other lack of coordination: Secondary | ICD-10-CM | POA: Diagnosis not present

## 2016-02-26 DIAGNOSIS — I69359 Hemiplegia and hemiparesis following cerebral infarction affecting unspecified side: Secondary | ICD-10-CM | POA: Diagnosis not present

## 2016-02-26 DIAGNOSIS — F0391 Unspecified dementia with behavioral disturbance: Secondary | ICD-10-CM | POA: Diagnosis not present

## 2016-02-26 DIAGNOSIS — R296 Repeated falls: Secondary | ICD-10-CM | POA: Diagnosis not present

## 2016-02-29 DIAGNOSIS — R296 Repeated falls: Secondary | ICD-10-CM | POA: Diagnosis not present

## 2016-02-29 DIAGNOSIS — R2689 Other abnormalities of gait and mobility: Secondary | ICD-10-CM | POA: Diagnosis not present

## 2016-02-29 DIAGNOSIS — F0391 Unspecified dementia with behavioral disturbance: Secondary | ICD-10-CM | POA: Diagnosis not present

## 2016-02-29 DIAGNOSIS — R278 Other lack of coordination: Secondary | ICD-10-CM | POA: Diagnosis not present

## 2016-02-29 DIAGNOSIS — M6281 Muscle weakness (generalized): Secondary | ICD-10-CM | POA: Diagnosis not present

## 2016-02-29 DIAGNOSIS — I69359 Hemiplegia and hemiparesis following cerebral infarction affecting unspecified side: Secondary | ICD-10-CM | POA: Diagnosis not present

## 2016-03-01 DIAGNOSIS — F321 Major depressive disorder, single episode, moderate: Secondary | ICD-10-CM | POA: Diagnosis not present

## 2016-03-01 DIAGNOSIS — F0281 Dementia in other diseases classified elsewhere with behavioral disturbance: Secondary | ICD-10-CM | POA: Diagnosis not present

## 2016-03-01 DIAGNOSIS — F0151 Vascular dementia with behavioral disturbance: Secondary | ICD-10-CM | POA: Diagnosis not present

## 2016-03-17 DIAGNOSIS — F0391 Unspecified dementia with behavioral disturbance: Secondary | ICD-10-CM | POA: Diagnosis not present

## 2016-03-17 DIAGNOSIS — I10 Essential (primary) hypertension: Secondary | ICD-10-CM | POA: Diagnosis not present

## 2016-03-17 DIAGNOSIS — E119 Type 2 diabetes mellitus without complications: Secondary | ICD-10-CM | POA: Diagnosis not present

## 2016-03-17 DIAGNOSIS — I4891 Unspecified atrial fibrillation: Secondary | ICD-10-CM | POA: Diagnosis not present

## 2016-04-01 DIAGNOSIS — F330 Major depressive disorder, recurrent, mild: Secondary | ICD-10-CM | POA: Diagnosis not present

## 2016-04-01 DIAGNOSIS — E119 Type 2 diabetes mellitus without complications: Secondary | ICD-10-CM | POA: Diagnosis not present

## 2016-04-01 DIAGNOSIS — I10 Essential (primary) hypertension: Secondary | ICD-10-CM | POA: Diagnosis not present

## 2016-04-01 DIAGNOSIS — G301 Alzheimer's disease with late onset: Secondary | ICD-10-CM | POA: Diagnosis not present

## 2016-04-14 DIAGNOSIS — R4182 Altered mental status, unspecified: Secondary | ICD-10-CM | POA: Diagnosis not present

## 2016-04-14 DIAGNOSIS — F0391 Unspecified dementia with behavioral disturbance: Secondary | ICD-10-CM | POA: Diagnosis not present

## 2016-04-14 DIAGNOSIS — R54 Age-related physical debility: Secondary | ICD-10-CM | POA: Diagnosis not present

## 2016-04-26 DIAGNOSIS — F321 Major depressive disorder, single episode, moderate: Secondary | ICD-10-CM | POA: Diagnosis not present

## 2016-04-26 DIAGNOSIS — F0281 Dementia in other diseases classified elsewhere with behavioral disturbance: Secondary | ICD-10-CM | POA: Diagnosis not present

## 2016-04-26 DIAGNOSIS — F0151 Vascular dementia with behavioral disturbance: Secondary | ICD-10-CM | POA: Diagnosis not present

## 2016-04-27 DIAGNOSIS — B351 Tinea unguium: Secondary | ICD-10-CM | POA: Diagnosis not present

## 2016-04-27 DIAGNOSIS — M79674 Pain in right toe(s): Secondary | ICD-10-CM | POA: Diagnosis not present

## 2016-05-12 DIAGNOSIS — F0391 Unspecified dementia with behavioral disturbance: Secondary | ICD-10-CM | POA: Diagnosis not present

## 2016-05-12 DIAGNOSIS — E119 Type 2 diabetes mellitus without complications: Secondary | ICD-10-CM | POA: Diagnosis not present

## 2016-05-12 DIAGNOSIS — I4891 Unspecified atrial fibrillation: Secondary | ICD-10-CM | POA: Diagnosis not present

## 2016-05-12 DIAGNOSIS — I10 Essential (primary) hypertension: Secondary | ICD-10-CM | POA: Diagnosis not present

## 2016-05-12 DIAGNOSIS — Z6824 Body mass index (BMI) 24.0-24.9, adult: Secondary | ICD-10-CM | POA: Diagnosis not present

## 2016-05-18 DIAGNOSIS — E119 Type 2 diabetes mellitus without complications: Secondary | ICD-10-CM | POA: Diagnosis not present

## 2016-06-14 DIAGNOSIS — F321 Major depressive disorder, single episode, moderate: Secondary | ICD-10-CM | POA: Diagnosis not present

## 2016-06-14 DIAGNOSIS — F0151 Vascular dementia with behavioral disturbance: Secondary | ICD-10-CM | POA: Diagnosis not present

## 2016-06-14 DIAGNOSIS — F0281 Dementia in other diseases classified elsewhere with behavioral disturbance: Secondary | ICD-10-CM | POA: Diagnosis not present

## 2016-07-14 DIAGNOSIS — F0391 Unspecified dementia with behavioral disturbance: Secondary | ICD-10-CM | POA: Diagnosis not present

## 2016-07-14 DIAGNOSIS — I10 Essential (primary) hypertension: Secondary | ICD-10-CM | POA: Diagnosis not present

## 2016-07-14 DIAGNOSIS — I4891 Unspecified atrial fibrillation: Secondary | ICD-10-CM | POA: Diagnosis not present

## 2016-07-14 DIAGNOSIS — E119 Type 2 diabetes mellitus without complications: Secondary | ICD-10-CM | POA: Diagnosis not present

## 2016-07-18 DIAGNOSIS — Z79899 Other long term (current) drug therapy: Secondary | ICD-10-CM | POA: Diagnosis not present

## 2016-08-02 DIAGNOSIS — F321 Major depressive disorder, single episode, moderate: Secondary | ICD-10-CM | POA: Diagnosis not present

## 2016-08-02 DIAGNOSIS — F0281 Dementia in other diseases classified elsewhere with behavioral disturbance: Secondary | ICD-10-CM | POA: Diagnosis not present

## 2016-08-02 DIAGNOSIS — F0151 Vascular dementia with behavioral disturbance: Secondary | ICD-10-CM | POA: Diagnosis not present

## 2016-08-12 DIAGNOSIS — I69359 Hemiplegia and hemiparesis following cerebral infarction affecting unspecified side: Secondary | ICD-10-CM | POA: Diagnosis not present

## 2016-08-12 DIAGNOSIS — R296 Repeated falls: Secondary | ICD-10-CM | POA: Diagnosis not present

## 2016-08-12 DIAGNOSIS — R2689 Other abnormalities of gait and mobility: Secondary | ICD-10-CM | POA: Diagnosis not present

## 2016-08-12 DIAGNOSIS — M6281 Muscle weakness (generalized): Secondary | ICD-10-CM | POA: Diagnosis not present

## 2016-08-12 DIAGNOSIS — R278 Other lack of coordination: Secondary | ICD-10-CM | POA: Diagnosis not present

## 2016-08-12 DIAGNOSIS — I4891 Unspecified atrial fibrillation: Secondary | ICD-10-CM | POA: Diagnosis not present

## 2016-08-12 DIAGNOSIS — F0391 Unspecified dementia with behavioral disturbance: Secondary | ICD-10-CM | POA: Diagnosis not present

## 2016-08-15 DIAGNOSIS — R2689 Other abnormalities of gait and mobility: Secondary | ICD-10-CM | POA: Diagnosis not present

## 2016-08-15 DIAGNOSIS — M6281 Muscle weakness (generalized): Secondary | ICD-10-CM | POA: Diagnosis not present

## 2016-08-15 DIAGNOSIS — R296 Repeated falls: Secondary | ICD-10-CM | POA: Diagnosis not present

## 2016-08-15 DIAGNOSIS — I69359 Hemiplegia and hemiparesis following cerebral infarction affecting unspecified side: Secondary | ICD-10-CM | POA: Diagnosis not present

## 2016-08-15 DIAGNOSIS — F0391 Unspecified dementia with behavioral disturbance: Secondary | ICD-10-CM | POA: Diagnosis not present

## 2016-08-15 DIAGNOSIS — R278 Other lack of coordination: Secondary | ICD-10-CM | POA: Diagnosis not present

## 2016-08-16 DIAGNOSIS — M6281 Muscle weakness (generalized): Secondary | ICD-10-CM | POA: Diagnosis not present

## 2016-08-16 DIAGNOSIS — R2689 Other abnormalities of gait and mobility: Secondary | ICD-10-CM | POA: Diagnosis not present

## 2016-08-16 DIAGNOSIS — I69359 Hemiplegia and hemiparesis following cerebral infarction affecting unspecified side: Secondary | ICD-10-CM | POA: Diagnosis not present

## 2016-08-16 DIAGNOSIS — R278 Other lack of coordination: Secondary | ICD-10-CM | POA: Diagnosis not present

## 2016-08-16 DIAGNOSIS — R296 Repeated falls: Secondary | ICD-10-CM | POA: Diagnosis not present

## 2016-08-16 DIAGNOSIS — F0391 Unspecified dementia with behavioral disturbance: Secondary | ICD-10-CM | POA: Diagnosis not present

## 2016-08-17 DIAGNOSIS — R2689 Other abnormalities of gait and mobility: Secondary | ICD-10-CM | POA: Diagnosis not present

## 2016-08-17 DIAGNOSIS — M6281 Muscle weakness (generalized): Secondary | ICD-10-CM | POA: Diagnosis not present

## 2016-08-17 DIAGNOSIS — R278 Other lack of coordination: Secondary | ICD-10-CM | POA: Diagnosis not present

## 2016-08-17 DIAGNOSIS — R296 Repeated falls: Secondary | ICD-10-CM | POA: Diagnosis not present

## 2016-08-17 DIAGNOSIS — I69359 Hemiplegia and hemiparesis following cerebral infarction affecting unspecified side: Secondary | ICD-10-CM | POA: Diagnosis not present

## 2016-08-17 DIAGNOSIS — F0391 Unspecified dementia with behavioral disturbance: Secondary | ICD-10-CM | POA: Diagnosis not present

## 2016-08-18 DIAGNOSIS — M6281 Muscle weakness (generalized): Secondary | ICD-10-CM | POA: Diagnosis not present

## 2016-08-18 DIAGNOSIS — Z9181 History of falling: Secondary | ICD-10-CM | POA: Diagnosis not present

## 2016-08-18 DIAGNOSIS — R278 Other lack of coordination: Secondary | ICD-10-CM | POA: Diagnosis not present

## 2016-08-18 DIAGNOSIS — R269 Unspecified abnormalities of gait and mobility: Secondary | ICD-10-CM | POA: Diagnosis not present

## 2016-08-18 DIAGNOSIS — W19XXXA Unspecified fall, initial encounter: Secondary | ICD-10-CM | POA: Diagnosis not present

## 2016-08-18 DIAGNOSIS — R296 Repeated falls: Secondary | ICD-10-CM | POA: Diagnosis not present

## 2016-08-18 DIAGNOSIS — R2689 Other abnormalities of gait and mobility: Secondary | ICD-10-CM | POA: Diagnosis not present

## 2016-08-18 DIAGNOSIS — R54 Age-related physical debility: Secondary | ICD-10-CM | POA: Diagnosis not present

## 2016-08-18 DIAGNOSIS — I69359 Hemiplegia and hemiparesis following cerebral infarction affecting unspecified side: Secondary | ICD-10-CM | POA: Diagnosis not present

## 2016-08-18 DIAGNOSIS — F0391 Unspecified dementia with behavioral disturbance: Secondary | ICD-10-CM | POA: Diagnosis not present

## 2016-08-18 DIAGNOSIS — G309 Alzheimer's disease, unspecified: Secondary | ICD-10-CM | POA: Diagnosis not present

## 2016-08-19 DIAGNOSIS — R296 Repeated falls: Secondary | ICD-10-CM | POA: Diagnosis not present

## 2016-08-19 DIAGNOSIS — M6281 Muscle weakness (generalized): Secondary | ICD-10-CM | POA: Diagnosis not present

## 2016-08-19 DIAGNOSIS — R2689 Other abnormalities of gait and mobility: Secondary | ICD-10-CM | POA: Diagnosis not present

## 2016-08-19 DIAGNOSIS — R278 Other lack of coordination: Secondary | ICD-10-CM | POA: Diagnosis not present

## 2016-08-19 DIAGNOSIS — I69359 Hemiplegia and hemiparesis following cerebral infarction affecting unspecified side: Secondary | ICD-10-CM | POA: Diagnosis not present

## 2016-08-19 DIAGNOSIS — F0391 Unspecified dementia with behavioral disturbance: Secondary | ICD-10-CM | POA: Diagnosis not present

## 2016-08-22 DIAGNOSIS — M6281 Muscle weakness (generalized): Secondary | ICD-10-CM | POA: Diagnosis not present

## 2016-08-22 DIAGNOSIS — R296 Repeated falls: Secondary | ICD-10-CM | POA: Diagnosis not present

## 2016-08-22 DIAGNOSIS — I69359 Hemiplegia and hemiparesis following cerebral infarction affecting unspecified side: Secondary | ICD-10-CM | POA: Diagnosis not present

## 2016-08-22 DIAGNOSIS — R2689 Other abnormalities of gait and mobility: Secondary | ICD-10-CM | POA: Diagnosis not present

## 2016-08-22 DIAGNOSIS — F0391 Unspecified dementia with behavioral disturbance: Secondary | ICD-10-CM | POA: Diagnosis not present

## 2016-08-22 DIAGNOSIS — R278 Other lack of coordination: Secondary | ICD-10-CM | POA: Diagnosis not present

## 2016-08-23 DIAGNOSIS — R296 Repeated falls: Secondary | ICD-10-CM | POA: Diagnosis not present

## 2016-08-23 DIAGNOSIS — F0391 Unspecified dementia with behavioral disturbance: Secondary | ICD-10-CM | POA: Diagnosis not present

## 2016-08-23 DIAGNOSIS — M6281 Muscle weakness (generalized): Secondary | ICD-10-CM | POA: Diagnosis not present

## 2016-08-23 DIAGNOSIS — R2689 Other abnormalities of gait and mobility: Secondary | ICD-10-CM | POA: Diagnosis not present

## 2016-08-23 DIAGNOSIS — F0281 Dementia in other diseases classified elsewhere with behavioral disturbance: Secondary | ICD-10-CM | POA: Diagnosis not present

## 2016-08-23 DIAGNOSIS — R278 Other lack of coordination: Secondary | ICD-10-CM | POA: Diagnosis not present

## 2016-08-23 DIAGNOSIS — F321 Major depressive disorder, single episode, moderate: Secondary | ICD-10-CM | POA: Diagnosis not present

## 2016-08-23 DIAGNOSIS — F0151 Vascular dementia with behavioral disturbance: Secondary | ICD-10-CM | POA: Diagnosis not present

## 2016-08-23 DIAGNOSIS — I69359 Hemiplegia and hemiparesis following cerebral infarction affecting unspecified side: Secondary | ICD-10-CM | POA: Diagnosis not present

## 2016-08-24 DIAGNOSIS — R296 Repeated falls: Secondary | ICD-10-CM | POA: Diagnosis not present

## 2016-08-24 DIAGNOSIS — F0391 Unspecified dementia with behavioral disturbance: Secondary | ICD-10-CM | POA: Diagnosis not present

## 2016-08-24 DIAGNOSIS — I69359 Hemiplegia and hemiparesis following cerebral infarction affecting unspecified side: Secondary | ICD-10-CM | POA: Diagnosis not present

## 2016-08-24 DIAGNOSIS — M6281 Muscle weakness (generalized): Secondary | ICD-10-CM | POA: Diagnosis not present

## 2016-08-24 DIAGNOSIS — R2689 Other abnormalities of gait and mobility: Secondary | ICD-10-CM | POA: Diagnosis not present

## 2016-08-24 DIAGNOSIS — R278 Other lack of coordination: Secondary | ICD-10-CM | POA: Diagnosis not present

## 2016-08-25 DIAGNOSIS — I69359 Hemiplegia and hemiparesis following cerebral infarction affecting unspecified side: Secondary | ICD-10-CM | POA: Diagnosis not present

## 2016-08-25 DIAGNOSIS — R296 Repeated falls: Secondary | ICD-10-CM | POA: Diagnosis not present

## 2016-08-25 DIAGNOSIS — F0391 Unspecified dementia with behavioral disturbance: Secondary | ICD-10-CM | POA: Diagnosis not present

## 2016-08-25 DIAGNOSIS — R278 Other lack of coordination: Secondary | ICD-10-CM | POA: Diagnosis not present

## 2016-08-25 DIAGNOSIS — M6281 Muscle weakness (generalized): Secondary | ICD-10-CM | POA: Diagnosis not present

## 2016-08-25 DIAGNOSIS — R2689 Other abnormalities of gait and mobility: Secondary | ICD-10-CM | POA: Diagnosis not present

## 2016-08-26 DIAGNOSIS — R2689 Other abnormalities of gait and mobility: Secondary | ICD-10-CM | POA: Diagnosis not present

## 2016-08-26 DIAGNOSIS — M6281 Muscle weakness (generalized): Secondary | ICD-10-CM | POA: Diagnosis not present

## 2016-08-26 DIAGNOSIS — R296 Repeated falls: Secondary | ICD-10-CM | POA: Diagnosis not present

## 2016-08-26 DIAGNOSIS — R278 Other lack of coordination: Secondary | ICD-10-CM | POA: Diagnosis not present

## 2016-08-26 DIAGNOSIS — I69359 Hemiplegia and hemiparesis following cerebral infarction affecting unspecified side: Secondary | ICD-10-CM | POA: Diagnosis not present

## 2016-08-26 DIAGNOSIS — F0391 Unspecified dementia with behavioral disturbance: Secondary | ICD-10-CM | POA: Diagnosis not present

## 2016-08-29 DIAGNOSIS — I69359 Hemiplegia and hemiparesis following cerebral infarction affecting unspecified side: Secondary | ICD-10-CM | POA: Diagnosis not present

## 2016-08-29 DIAGNOSIS — M6281 Muscle weakness (generalized): Secondary | ICD-10-CM | POA: Diagnosis not present

## 2016-08-29 DIAGNOSIS — R278 Other lack of coordination: Secondary | ICD-10-CM | POA: Diagnosis not present

## 2016-08-29 DIAGNOSIS — R2689 Other abnormalities of gait and mobility: Secondary | ICD-10-CM | POA: Diagnosis not present

## 2016-08-29 DIAGNOSIS — R296 Repeated falls: Secondary | ICD-10-CM | POA: Diagnosis not present

## 2016-08-29 DIAGNOSIS — F0391 Unspecified dementia with behavioral disturbance: Secondary | ICD-10-CM | POA: Diagnosis not present

## 2016-08-30 DIAGNOSIS — R278 Other lack of coordination: Secondary | ICD-10-CM | POA: Diagnosis not present

## 2016-08-30 DIAGNOSIS — R2689 Other abnormalities of gait and mobility: Secondary | ICD-10-CM | POA: Diagnosis not present

## 2016-08-30 DIAGNOSIS — F0391 Unspecified dementia with behavioral disturbance: Secondary | ICD-10-CM | POA: Diagnosis not present

## 2016-08-30 DIAGNOSIS — I69359 Hemiplegia and hemiparesis following cerebral infarction affecting unspecified side: Secondary | ICD-10-CM | POA: Diagnosis not present

## 2016-08-30 DIAGNOSIS — M6281 Muscle weakness (generalized): Secondary | ICD-10-CM | POA: Diagnosis not present

## 2016-08-30 DIAGNOSIS — R296 Repeated falls: Secondary | ICD-10-CM | POA: Diagnosis not present

## 2016-08-31 DIAGNOSIS — I69359 Hemiplegia and hemiparesis following cerebral infarction affecting unspecified side: Secondary | ICD-10-CM | POA: Diagnosis not present

## 2016-08-31 DIAGNOSIS — M6281 Muscle weakness (generalized): Secondary | ICD-10-CM | POA: Diagnosis not present

## 2016-08-31 DIAGNOSIS — R278 Other lack of coordination: Secondary | ICD-10-CM | POA: Diagnosis not present

## 2016-08-31 DIAGNOSIS — F0391 Unspecified dementia with behavioral disturbance: Secondary | ICD-10-CM | POA: Diagnosis not present

## 2016-08-31 DIAGNOSIS — R296 Repeated falls: Secondary | ICD-10-CM | POA: Diagnosis not present

## 2016-08-31 DIAGNOSIS — R2689 Other abnormalities of gait and mobility: Secondary | ICD-10-CM | POA: Diagnosis not present

## 2016-09-01 DIAGNOSIS — J449 Chronic obstructive pulmonary disease, unspecified: Secondary | ICD-10-CM | POA: Diagnosis not present

## 2016-09-01 DIAGNOSIS — I10 Essential (primary) hypertension: Secondary | ICD-10-CM | POA: Diagnosis not present

## 2016-09-01 DIAGNOSIS — I5020 Unspecified systolic (congestive) heart failure: Secondary | ICD-10-CM | POA: Diagnosis not present

## 2016-09-01 DIAGNOSIS — D649 Anemia, unspecified: Secondary | ICD-10-CM | POA: Diagnosis not present

## 2016-09-07 DIAGNOSIS — N390 Urinary tract infection, site not specified: Secondary | ICD-10-CM | POA: Diagnosis not present

## 2016-09-08 DIAGNOSIS — F0391 Unspecified dementia with behavioral disturbance: Secondary | ICD-10-CM | POA: Diagnosis not present

## 2016-09-08 DIAGNOSIS — I10 Essential (primary) hypertension: Secondary | ICD-10-CM | POA: Diagnosis not present

## 2016-09-08 DIAGNOSIS — I4891 Unspecified atrial fibrillation: Secondary | ICD-10-CM | POA: Diagnosis not present

## 2016-09-08 DIAGNOSIS — E119 Type 2 diabetes mellitus without complications: Secondary | ICD-10-CM | POA: Diagnosis not present

## 2016-09-08 DIAGNOSIS — Z6824 Body mass index (BMI) 24.0-24.9, adult: Secondary | ICD-10-CM | POA: Diagnosis not present

## 2016-09-13 DIAGNOSIS — I10 Essential (primary) hypertension: Secondary | ICD-10-CM | POA: Diagnosis not present

## 2016-09-13 DIAGNOSIS — J189 Pneumonia, unspecified organism: Secondary | ICD-10-CM | POA: Diagnosis not present

## 2016-09-13 DIAGNOSIS — Z87891 Personal history of nicotine dependence: Secondary | ICD-10-CM | POA: Diagnosis not present

## 2016-09-13 DIAGNOSIS — I2510 Atherosclerotic heart disease of native coronary artery without angina pectoris: Secondary | ICD-10-CM | POA: Diagnosis present

## 2016-09-13 DIAGNOSIS — G92 Toxic encephalopathy: Secondary | ICD-10-CM | POA: Diagnosis not present

## 2016-09-13 DIAGNOSIS — E1122 Type 2 diabetes mellitus with diabetic chronic kidney disease: Secondary | ICD-10-CM | POA: Diagnosis present

## 2016-09-13 DIAGNOSIS — G301 Alzheimer's disease with late onset: Secondary | ICD-10-CM | POA: Diagnosis present

## 2016-09-13 DIAGNOSIS — I130 Hypertensive heart and chronic kidney disease with heart failure and stage 1 through stage 4 chronic kidney disease, or unspecified chronic kidney disease: Secondary | ICD-10-CM | POA: Diagnosis present

## 2016-09-13 DIAGNOSIS — J181 Lobar pneumonia, unspecified organism: Secondary | ICD-10-CM | POA: Diagnosis present

## 2016-09-13 DIAGNOSIS — N390 Urinary tract infection, site not specified: Secondary | ICD-10-CM | POA: Diagnosis not present

## 2016-09-13 DIAGNOSIS — Z23 Encounter for immunization: Secondary | ICD-10-CM | POA: Diagnosis not present

## 2016-09-13 DIAGNOSIS — F0390 Unspecified dementia without behavioral disturbance: Secondary | ICD-10-CM | POA: Diagnosis not present

## 2016-09-13 DIAGNOSIS — E119 Type 2 diabetes mellitus without complications: Secondary | ICD-10-CM | POA: Diagnosis not present

## 2016-09-13 DIAGNOSIS — B9689 Other specified bacterial agents as the cause of diseases classified elsewhere: Secondary | ICD-10-CM | POA: Diagnosis present

## 2016-09-13 DIAGNOSIS — R4182 Altered mental status, unspecified: Secondary | ICD-10-CM | POA: Diagnosis not present

## 2016-09-13 DIAGNOSIS — F0281 Dementia in other diseases classified elsewhere with behavioral disturbance: Secondary | ICD-10-CM | POA: Diagnosis present

## 2016-09-13 DIAGNOSIS — Z66 Do not resuscitate: Secondary | ICD-10-CM | POA: Diagnosis not present

## 2016-09-13 DIAGNOSIS — Z79899 Other long term (current) drug therapy: Secondary | ICD-10-CM | POA: Diagnosis not present

## 2016-09-13 DIAGNOSIS — I509 Heart failure, unspecified: Secondary | ICD-10-CM | POA: Diagnosis present

## 2016-09-13 DIAGNOSIS — R279 Unspecified lack of coordination: Secondary | ICD-10-CM | POA: Diagnosis not present

## 2016-09-13 DIAGNOSIS — Z9181 History of falling: Secondary | ICD-10-CM | POA: Diagnosis not present

## 2016-09-13 DIAGNOSIS — R079 Chest pain, unspecified: Secondary | ICD-10-CM | POA: Diagnosis not present

## 2016-09-13 DIAGNOSIS — N189 Chronic kidney disease, unspecified: Secondary | ICD-10-CM | POA: Diagnosis present

## 2016-09-13 DIAGNOSIS — Z7901 Long term (current) use of anticoagulants: Secondary | ICD-10-CM | POA: Diagnosis not present

## 2016-09-13 DIAGNOSIS — R031 Nonspecific low blood-pressure reading: Secondary | ICD-10-CM | POA: Diagnosis not present

## 2016-09-13 DIAGNOSIS — N3000 Acute cystitis without hematuria: Secondary | ICD-10-CM | POA: Diagnosis present

## 2016-09-13 DIAGNOSIS — Z794 Long term (current) use of insulin: Secondary | ICD-10-CM | POA: Diagnosis not present

## 2016-09-13 DIAGNOSIS — G20 Parkinson's disease: Secondary | ICD-10-CM | POA: Diagnosis present

## 2016-09-13 DIAGNOSIS — Z743 Need for continuous supervision: Secondary | ICD-10-CM | POA: Diagnosis not present

## 2016-09-13 DIAGNOSIS — K5900 Constipation, unspecified: Secondary | ICD-10-CM | POA: Diagnosis present

## 2016-09-16 DIAGNOSIS — E785 Hyperlipidemia, unspecified: Secondary | ICD-10-CM | POA: Diagnosis not present

## 2016-09-16 DIAGNOSIS — Z79899 Other long term (current) drug therapy: Secondary | ICD-10-CM | POA: Diagnosis not present

## 2016-09-19 DIAGNOSIS — J189 Pneumonia, unspecified organism: Secondary | ICD-10-CM | POA: Diagnosis not present

## 2016-09-19 DIAGNOSIS — R296 Repeated falls: Secondary | ICD-10-CM | POA: Diagnosis not present

## 2016-09-19 DIAGNOSIS — F0390 Unspecified dementia without behavioral disturbance: Secondary | ICD-10-CM | POA: Diagnosis not present

## 2016-09-19 DIAGNOSIS — R1312 Dysphagia, oropharyngeal phase: Secondary | ICD-10-CM | POA: Diagnosis not present

## 2016-09-19 DIAGNOSIS — R2681 Unsteadiness on feet: Secondary | ICD-10-CM | POA: Diagnosis not present

## 2016-09-19 DIAGNOSIS — R278 Other lack of coordination: Secondary | ICD-10-CM | POA: Diagnosis not present

## 2016-09-19 DIAGNOSIS — R2689 Other abnormalities of gait and mobility: Secondary | ICD-10-CM | POA: Diagnosis not present

## 2016-09-19 DIAGNOSIS — I4891 Unspecified atrial fibrillation: Secondary | ICD-10-CM | POA: Diagnosis not present

## 2016-09-19 DIAGNOSIS — I69359 Hemiplegia and hemiparesis following cerebral infarction affecting unspecified side: Secondary | ICD-10-CM | POA: Diagnosis not present

## 2016-09-19 DIAGNOSIS — Z87440 Personal history of urinary (tract) infections: Secondary | ICD-10-CM | POA: Diagnosis not present

## 2016-09-19 DIAGNOSIS — E1169 Type 2 diabetes mellitus with other specified complication: Secondary | ICD-10-CM | POA: Diagnosis not present

## 2016-09-19 DIAGNOSIS — M6281 Muscle weakness (generalized): Secondary | ICD-10-CM | POA: Diagnosis not present

## 2016-09-19 DIAGNOSIS — E118 Type 2 diabetes mellitus with unspecified complications: Secondary | ICD-10-CM | POA: Diagnosis not present

## 2016-09-19 DIAGNOSIS — F0391 Unspecified dementia with behavioral disturbance: Secondary | ICD-10-CM | POA: Diagnosis not present

## 2016-09-20 DIAGNOSIS — R278 Other lack of coordination: Secondary | ICD-10-CM | POA: Diagnosis not present

## 2016-09-20 DIAGNOSIS — R2689 Other abnormalities of gait and mobility: Secondary | ICD-10-CM | POA: Diagnosis not present

## 2016-09-20 DIAGNOSIS — M6281 Muscle weakness (generalized): Secondary | ICD-10-CM | POA: Diagnosis not present

## 2016-09-20 DIAGNOSIS — F0391 Unspecified dementia with behavioral disturbance: Secondary | ICD-10-CM | POA: Diagnosis not present

## 2016-09-20 DIAGNOSIS — I69359 Hemiplegia and hemiparesis following cerebral infarction affecting unspecified side: Secondary | ICD-10-CM | POA: Diagnosis not present

## 2016-09-20 DIAGNOSIS — R296 Repeated falls: Secondary | ICD-10-CM | POA: Diagnosis not present

## 2016-09-21 DIAGNOSIS — M6281 Muscle weakness (generalized): Secondary | ICD-10-CM | POA: Diagnosis not present

## 2016-09-21 DIAGNOSIS — I69359 Hemiplegia and hemiparesis following cerebral infarction affecting unspecified side: Secondary | ICD-10-CM | POA: Diagnosis not present

## 2016-09-21 DIAGNOSIS — F0391 Unspecified dementia with behavioral disturbance: Secondary | ICD-10-CM | POA: Diagnosis not present

## 2016-09-21 DIAGNOSIS — R278 Other lack of coordination: Secondary | ICD-10-CM | POA: Diagnosis not present

## 2016-09-21 DIAGNOSIS — R2689 Other abnormalities of gait and mobility: Secondary | ICD-10-CM | POA: Diagnosis not present

## 2016-09-21 DIAGNOSIS — R296 Repeated falls: Secondary | ICD-10-CM | POA: Diagnosis not present

## 2016-09-22 DIAGNOSIS — I69359 Hemiplegia and hemiparesis following cerebral infarction affecting unspecified side: Secondary | ICD-10-CM | POA: Diagnosis not present

## 2016-09-22 DIAGNOSIS — R278 Other lack of coordination: Secondary | ICD-10-CM | POA: Diagnosis not present

## 2016-09-22 DIAGNOSIS — R2689 Other abnormalities of gait and mobility: Secondary | ICD-10-CM | POA: Diagnosis not present

## 2016-09-22 DIAGNOSIS — M6281 Muscle weakness (generalized): Secondary | ICD-10-CM | POA: Diagnosis not present

## 2016-09-22 DIAGNOSIS — R296 Repeated falls: Secondary | ICD-10-CM | POA: Diagnosis not present

## 2016-09-22 DIAGNOSIS — F0391 Unspecified dementia with behavioral disturbance: Secondary | ICD-10-CM | POA: Diagnosis not present

## 2016-09-23 DIAGNOSIS — M6281 Muscle weakness (generalized): Secondary | ICD-10-CM | POA: Diagnosis not present

## 2016-09-23 DIAGNOSIS — R2689 Other abnormalities of gait and mobility: Secondary | ICD-10-CM | POA: Diagnosis not present

## 2016-09-23 DIAGNOSIS — I69359 Hemiplegia and hemiparesis following cerebral infarction affecting unspecified side: Secondary | ICD-10-CM | POA: Diagnosis not present

## 2016-09-23 DIAGNOSIS — R278 Other lack of coordination: Secondary | ICD-10-CM | POA: Diagnosis not present

## 2016-09-23 DIAGNOSIS — R296 Repeated falls: Secondary | ICD-10-CM | POA: Diagnosis not present

## 2016-09-23 DIAGNOSIS — F0391 Unspecified dementia with behavioral disturbance: Secondary | ICD-10-CM | POA: Diagnosis not present

## 2016-09-26 DIAGNOSIS — R2689 Other abnormalities of gait and mobility: Secondary | ICD-10-CM | POA: Diagnosis not present

## 2016-09-26 DIAGNOSIS — I69359 Hemiplegia and hemiparesis following cerebral infarction affecting unspecified side: Secondary | ICD-10-CM | POA: Diagnosis not present

## 2016-09-26 DIAGNOSIS — M6281 Muscle weakness (generalized): Secondary | ICD-10-CM | POA: Diagnosis not present

## 2016-09-26 DIAGNOSIS — F0391 Unspecified dementia with behavioral disturbance: Secondary | ICD-10-CM | POA: Diagnosis not present

## 2016-09-26 DIAGNOSIS — R278 Other lack of coordination: Secondary | ICD-10-CM | POA: Diagnosis not present

## 2016-09-26 DIAGNOSIS — R296 Repeated falls: Secondary | ICD-10-CM | POA: Diagnosis not present

## 2016-09-27 DIAGNOSIS — I69359 Hemiplegia and hemiparesis following cerebral infarction affecting unspecified side: Secondary | ICD-10-CM | POA: Diagnosis not present

## 2016-09-27 DIAGNOSIS — R2689 Other abnormalities of gait and mobility: Secondary | ICD-10-CM | POA: Diagnosis not present

## 2016-09-27 DIAGNOSIS — F0391 Unspecified dementia with behavioral disturbance: Secondary | ICD-10-CM | POA: Diagnosis not present

## 2016-09-27 DIAGNOSIS — M6281 Muscle weakness (generalized): Secondary | ICD-10-CM | POA: Diagnosis not present

## 2016-09-27 DIAGNOSIS — R296 Repeated falls: Secondary | ICD-10-CM | POA: Diagnosis not present

## 2016-09-27 DIAGNOSIS — R278 Other lack of coordination: Secondary | ICD-10-CM | POA: Diagnosis not present

## 2016-09-28 DIAGNOSIS — R2689 Other abnormalities of gait and mobility: Secondary | ICD-10-CM | POA: Diagnosis not present

## 2016-09-28 DIAGNOSIS — F0391 Unspecified dementia with behavioral disturbance: Secondary | ICD-10-CM | POA: Diagnosis not present

## 2016-09-28 DIAGNOSIS — M6281 Muscle weakness (generalized): Secondary | ICD-10-CM | POA: Diagnosis not present

## 2016-09-28 DIAGNOSIS — R278 Other lack of coordination: Secondary | ICD-10-CM | POA: Diagnosis not present

## 2016-09-28 DIAGNOSIS — R296 Repeated falls: Secondary | ICD-10-CM | POA: Diagnosis not present

## 2016-09-28 DIAGNOSIS — I69359 Hemiplegia and hemiparesis following cerebral infarction affecting unspecified side: Secondary | ICD-10-CM | POA: Diagnosis not present

## 2016-09-29 DIAGNOSIS — R296 Repeated falls: Secondary | ICD-10-CM | POA: Diagnosis not present

## 2016-09-29 DIAGNOSIS — F0391 Unspecified dementia with behavioral disturbance: Secondary | ICD-10-CM | POA: Diagnosis not present

## 2016-09-29 DIAGNOSIS — R2689 Other abnormalities of gait and mobility: Secondary | ICD-10-CM | POA: Diagnosis not present

## 2016-09-29 DIAGNOSIS — R278 Other lack of coordination: Secondary | ICD-10-CM | POA: Diagnosis not present

## 2016-09-29 DIAGNOSIS — I69359 Hemiplegia and hemiparesis following cerebral infarction affecting unspecified side: Secondary | ICD-10-CM | POA: Diagnosis not present

## 2016-09-29 DIAGNOSIS — M6281 Muscle weakness (generalized): Secondary | ICD-10-CM | POA: Diagnosis not present

## 2016-09-30 DIAGNOSIS — R278 Other lack of coordination: Secondary | ICD-10-CM | POA: Diagnosis not present

## 2016-09-30 DIAGNOSIS — I69359 Hemiplegia and hemiparesis following cerebral infarction affecting unspecified side: Secondary | ICD-10-CM | POA: Diagnosis not present

## 2016-09-30 DIAGNOSIS — R2689 Other abnormalities of gait and mobility: Secondary | ICD-10-CM | POA: Diagnosis not present

## 2016-09-30 DIAGNOSIS — F0391 Unspecified dementia with behavioral disturbance: Secondary | ICD-10-CM | POA: Diagnosis not present

## 2016-09-30 DIAGNOSIS — R296 Repeated falls: Secondary | ICD-10-CM | POA: Diagnosis not present

## 2016-09-30 DIAGNOSIS — M6281 Muscle weakness (generalized): Secondary | ICD-10-CM | POA: Diagnosis not present

## 2016-10-03 DIAGNOSIS — R278 Other lack of coordination: Secondary | ICD-10-CM | POA: Diagnosis not present

## 2016-10-03 DIAGNOSIS — R2689 Other abnormalities of gait and mobility: Secondary | ICD-10-CM | POA: Diagnosis not present

## 2016-10-03 DIAGNOSIS — I4891 Unspecified atrial fibrillation: Secondary | ICD-10-CM | POA: Diagnosis not present

## 2016-10-03 DIAGNOSIS — M6281 Muscle weakness (generalized): Secondary | ICD-10-CM | POA: Diagnosis not present

## 2016-10-03 DIAGNOSIS — F0391 Unspecified dementia with behavioral disturbance: Secondary | ICD-10-CM | POA: Diagnosis not present

## 2016-10-03 DIAGNOSIS — I69359 Hemiplegia and hemiparesis following cerebral infarction affecting unspecified side: Secondary | ICD-10-CM | POA: Diagnosis not present

## 2016-10-03 DIAGNOSIS — R296 Repeated falls: Secondary | ICD-10-CM | POA: Diagnosis not present

## 2016-10-03 DIAGNOSIS — J189 Pneumonia, unspecified organism: Secondary | ICD-10-CM | POA: Diagnosis not present

## 2016-10-03 DIAGNOSIS — Z87440 Personal history of urinary (tract) infections: Secondary | ICD-10-CM | POA: Diagnosis not present

## 2016-10-03 DIAGNOSIS — R2681 Unsteadiness on feet: Secondary | ICD-10-CM | POA: Diagnosis not present

## 2016-10-03 DIAGNOSIS — R1312 Dysphagia, oropharyngeal phase: Secondary | ICD-10-CM | POA: Diagnosis not present

## 2016-10-04 DIAGNOSIS — F0391 Unspecified dementia with behavioral disturbance: Secondary | ICD-10-CM | POA: Diagnosis not present

## 2016-10-04 DIAGNOSIS — R296 Repeated falls: Secondary | ICD-10-CM | POA: Diagnosis not present

## 2016-10-04 DIAGNOSIS — F0151 Vascular dementia with behavioral disturbance: Secondary | ICD-10-CM | POA: Diagnosis not present

## 2016-10-04 DIAGNOSIS — R278 Other lack of coordination: Secondary | ICD-10-CM | POA: Diagnosis not present

## 2016-10-04 DIAGNOSIS — M6281 Muscle weakness (generalized): Secondary | ICD-10-CM | POA: Diagnosis not present

## 2016-10-04 DIAGNOSIS — R2689 Other abnormalities of gait and mobility: Secondary | ICD-10-CM | POA: Diagnosis not present

## 2016-10-04 DIAGNOSIS — I69359 Hemiplegia and hemiparesis following cerebral infarction affecting unspecified side: Secondary | ICD-10-CM | POA: Diagnosis not present

## 2016-10-04 DIAGNOSIS — F321 Major depressive disorder, single episode, moderate: Secondary | ICD-10-CM | POA: Diagnosis not present

## 2016-10-05 DIAGNOSIS — R296 Repeated falls: Secondary | ICD-10-CM | POA: Diagnosis not present

## 2016-10-05 DIAGNOSIS — M6281 Muscle weakness (generalized): Secondary | ICD-10-CM | POA: Diagnosis not present

## 2016-10-05 DIAGNOSIS — F0391 Unspecified dementia with behavioral disturbance: Secondary | ICD-10-CM | POA: Diagnosis not present

## 2016-10-05 DIAGNOSIS — I69359 Hemiplegia and hemiparesis following cerebral infarction affecting unspecified side: Secondary | ICD-10-CM | POA: Diagnosis not present

## 2016-10-05 DIAGNOSIS — R2689 Other abnormalities of gait and mobility: Secondary | ICD-10-CM | POA: Diagnosis not present

## 2016-10-05 DIAGNOSIS — R278 Other lack of coordination: Secondary | ICD-10-CM | POA: Diagnosis not present

## 2016-10-06 DIAGNOSIS — R278 Other lack of coordination: Secondary | ICD-10-CM | POA: Diagnosis not present

## 2016-10-06 DIAGNOSIS — R296 Repeated falls: Secondary | ICD-10-CM | POA: Diagnosis not present

## 2016-10-06 DIAGNOSIS — I69359 Hemiplegia and hemiparesis following cerebral infarction affecting unspecified side: Secondary | ICD-10-CM | POA: Diagnosis not present

## 2016-10-06 DIAGNOSIS — R2689 Other abnormalities of gait and mobility: Secondary | ICD-10-CM | POA: Diagnosis not present

## 2016-10-06 DIAGNOSIS — F0391 Unspecified dementia with behavioral disturbance: Secondary | ICD-10-CM | POA: Diagnosis not present

## 2016-10-06 DIAGNOSIS — M6281 Muscle weakness (generalized): Secondary | ICD-10-CM | POA: Diagnosis not present

## 2016-10-07 DIAGNOSIS — R278 Other lack of coordination: Secondary | ICD-10-CM | POA: Diagnosis not present

## 2016-10-07 DIAGNOSIS — R2689 Other abnormalities of gait and mobility: Secondary | ICD-10-CM | POA: Diagnosis not present

## 2016-10-07 DIAGNOSIS — M6281 Muscle weakness (generalized): Secondary | ICD-10-CM | POA: Diagnosis not present

## 2016-10-07 DIAGNOSIS — I69359 Hemiplegia and hemiparesis following cerebral infarction affecting unspecified side: Secondary | ICD-10-CM | POA: Diagnosis not present

## 2016-10-07 DIAGNOSIS — F0391 Unspecified dementia with behavioral disturbance: Secondary | ICD-10-CM | POA: Diagnosis not present

## 2016-10-07 DIAGNOSIS — R296 Repeated falls: Secondary | ICD-10-CM | POA: Diagnosis not present

## 2016-10-10 DIAGNOSIS — I69359 Hemiplegia and hemiparesis following cerebral infarction affecting unspecified side: Secondary | ICD-10-CM | POA: Diagnosis not present

## 2016-10-10 DIAGNOSIS — R2689 Other abnormalities of gait and mobility: Secondary | ICD-10-CM | POA: Diagnosis not present

## 2016-10-10 DIAGNOSIS — F0391 Unspecified dementia with behavioral disturbance: Secondary | ICD-10-CM | POA: Diagnosis not present

## 2016-10-10 DIAGNOSIS — M6281 Muscle weakness (generalized): Secondary | ICD-10-CM | POA: Diagnosis not present

## 2016-10-10 DIAGNOSIS — R296 Repeated falls: Secondary | ICD-10-CM | POA: Diagnosis not present

## 2016-10-10 DIAGNOSIS — R278 Other lack of coordination: Secondary | ICD-10-CM | POA: Diagnosis not present

## 2016-10-11 DIAGNOSIS — R296 Repeated falls: Secondary | ICD-10-CM | POA: Diagnosis not present

## 2016-10-11 DIAGNOSIS — M6281 Muscle weakness (generalized): Secondary | ICD-10-CM | POA: Diagnosis not present

## 2016-10-11 DIAGNOSIS — I69359 Hemiplegia and hemiparesis following cerebral infarction affecting unspecified side: Secondary | ICD-10-CM | POA: Diagnosis not present

## 2016-10-11 DIAGNOSIS — R2689 Other abnormalities of gait and mobility: Secondary | ICD-10-CM | POA: Diagnosis not present

## 2016-10-11 DIAGNOSIS — R278 Other lack of coordination: Secondary | ICD-10-CM | POA: Diagnosis not present

## 2016-10-11 DIAGNOSIS — F0391 Unspecified dementia with behavioral disturbance: Secondary | ICD-10-CM | POA: Diagnosis not present

## 2016-10-12 DIAGNOSIS — R2689 Other abnormalities of gait and mobility: Secondary | ICD-10-CM | POA: Diagnosis not present

## 2016-10-12 DIAGNOSIS — R296 Repeated falls: Secondary | ICD-10-CM | POA: Diagnosis not present

## 2016-10-12 DIAGNOSIS — M6281 Muscle weakness (generalized): Secondary | ICD-10-CM | POA: Diagnosis not present

## 2016-10-12 DIAGNOSIS — I69359 Hemiplegia and hemiparesis following cerebral infarction affecting unspecified side: Secondary | ICD-10-CM | POA: Diagnosis not present

## 2016-10-12 DIAGNOSIS — R278 Other lack of coordination: Secondary | ICD-10-CM | POA: Diagnosis not present

## 2016-10-12 DIAGNOSIS — F0391 Unspecified dementia with behavioral disturbance: Secondary | ICD-10-CM | POA: Diagnosis not present

## 2016-10-13 DIAGNOSIS — R278 Other lack of coordination: Secondary | ICD-10-CM | POA: Diagnosis not present

## 2016-10-13 DIAGNOSIS — F0391 Unspecified dementia with behavioral disturbance: Secondary | ICD-10-CM | POA: Diagnosis not present

## 2016-10-13 DIAGNOSIS — M6281 Muscle weakness (generalized): Secondary | ICD-10-CM | POA: Diagnosis not present

## 2016-10-13 DIAGNOSIS — R2689 Other abnormalities of gait and mobility: Secondary | ICD-10-CM | POA: Diagnosis not present

## 2016-10-13 DIAGNOSIS — I69359 Hemiplegia and hemiparesis following cerebral infarction affecting unspecified side: Secondary | ICD-10-CM | POA: Diagnosis not present

## 2016-10-13 DIAGNOSIS — R296 Repeated falls: Secondary | ICD-10-CM | POA: Diagnosis not present

## 2016-10-14 DIAGNOSIS — R278 Other lack of coordination: Secondary | ICD-10-CM | POA: Diagnosis not present

## 2016-10-14 DIAGNOSIS — M6281 Muscle weakness (generalized): Secondary | ICD-10-CM | POA: Diagnosis not present

## 2016-10-14 DIAGNOSIS — R2689 Other abnormalities of gait and mobility: Secondary | ICD-10-CM | POA: Diagnosis not present

## 2016-10-14 DIAGNOSIS — R296 Repeated falls: Secondary | ICD-10-CM | POA: Diagnosis not present

## 2016-10-14 DIAGNOSIS — F0391 Unspecified dementia with behavioral disturbance: Secondary | ICD-10-CM | POA: Diagnosis not present

## 2016-10-14 DIAGNOSIS — I69359 Hemiplegia and hemiparesis following cerebral infarction affecting unspecified side: Secondary | ICD-10-CM | POA: Diagnosis not present

## 2016-10-17 DIAGNOSIS — F0391 Unspecified dementia with behavioral disturbance: Secondary | ICD-10-CM | POA: Diagnosis not present

## 2016-10-17 DIAGNOSIS — R296 Repeated falls: Secondary | ICD-10-CM | POA: Diagnosis not present

## 2016-10-17 DIAGNOSIS — I69359 Hemiplegia and hemiparesis following cerebral infarction affecting unspecified side: Secondary | ICD-10-CM | POA: Diagnosis not present

## 2016-10-17 DIAGNOSIS — R278 Other lack of coordination: Secondary | ICD-10-CM | POA: Diagnosis not present

## 2016-10-17 DIAGNOSIS — R2689 Other abnormalities of gait and mobility: Secondary | ICD-10-CM | POA: Diagnosis not present

## 2016-10-17 DIAGNOSIS — M6281 Muscle weakness (generalized): Secondary | ICD-10-CM | POA: Diagnosis not present

## 2016-10-18 DIAGNOSIS — I69359 Hemiplegia and hemiparesis following cerebral infarction affecting unspecified side: Secondary | ICD-10-CM | POA: Diagnosis not present

## 2016-10-18 DIAGNOSIS — R278 Other lack of coordination: Secondary | ICD-10-CM | POA: Diagnosis not present

## 2016-10-18 DIAGNOSIS — R2689 Other abnormalities of gait and mobility: Secondary | ICD-10-CM | POA: Diagnosis not present

## 2016-10-18 DIAGNOSIS — R296 Repeated falls: Secondary | ICD-10-CM | POA: Diagnosis not present

## 2016-10-18 DIAGNOSIS — F0391 Unspecified dementia with behavioral disturbance: Secondary | ICD-10-CM | POA: Diagnosis not present

## 2016-10-18 DIAGNOSIS — M6281 Muscle weakness (generalized): Secondary | ICD-10-CM | POA: Diagnosis not present

## 2016-10-19 DIAGNOSIS — I69359 Hemiplegia and hemiparesis following cerebral infarction affecting unspecified side: Secondary | ICD-10-CM | POA: Diagnosis not present

## 2016-10-19 DIAGNOSIS — F0391 Unspecified dementia with behavioral disturbance: Secondary | ICD-10-CM | POA: Diagnosis not present

## 2016-10-19 DIAGNOSIS — R278 Other lack of coordination: Secondary | ICD-10-CM | POA: Diagnosis not present

## 2016-10-19 DIAGNOSIS — R2689 Other abnormalities of gait and mobility: Secondary | ICD-10-CM | POA: Diagnosis not present

## 2016-10-19 DIAGNOSIS — R296 Repeated falls: Secondary | ICD-10-CM | POA: Diagnosis not present

## 2016-10-19 DIAGNOSIS — M6281 Muscle weakness (generalized): Secondary | ICD-10-CM | POA: Diagnosis not present

## 2016-10-20 DIAGNOSIS — I69359 Hemiplegia and hemiparesis following cerebral infarction affecting unspecified side: Secondary | ICD-10-CM | POA: Diagnosis not present

## 2016-10-20 DIAGNOSIS — R2689 Other abnormalities of gait and mobility: Secondary | ICD-10-CM | POA: Diagnosis not present

## 2016-10-20 DIAGNOSIS — M6281 Muscle weakness (generalized): Secondary | ICD-10-CM | POA: Diagnosis not present

## 2016-10-20 DIAGNOSIS — F0391 Unspecified dementia with behavioral disturbance: Secondary | ICD-10-CM | POA: Diagnosis not present

## 2016-10-20 DIAGNOSIS — R296 Repeated falls: Secondary | ICD-10-CM | POA: Diagnosis not present

## 2016-10-20 DIAGNOSIS — R278 Other lack of coordination: Secondary | ICD-10-CM | POA: Diagnosis not present

## 2016-10-28 DIAGNOSIS — Z9181 History of falling: Secondary | ICD-10-CM | POA: Diagnosis not present

## 2016-10-28 DIAGNOSIS — Z0000 Encounter for general adult medical examination without abnormal findings: Secondary | ICD-10-CM | POA: Diagnosis not present

## 2016-10-28 DIAGNOSIS — Z139 Encounter for screening, unspecified: Secondary | ICD-10-CM | POA: Diagnosis not present

## 2016-10-28 DIAGNOSIS — Z1331 Encounter for screening for depression: Secondary | ICD-10-CM | POA: Diagnosis not present

## 2016-11-02 DIAGNOSIS — M79674 Pain in right toe(s): Secondary | ICD-10-CM | POA: Diagnosis not present

## 2016-11-02 DIAGNOSIS — B351 Tinea unguium: Secondary | ICD-10-CM | POA: Diagnosis not present

## 2016-11-02 DIAGNOSIS — M79675 Pain in left toe(s): Secondary | ICD-10-CM | POA: Diagnosis not present

## 2016-11-10 IMAGING — CT CT HEAD W/O CM
4 series · 15 of 47 positions shown, 17 images · non-contrast
Comparison: None.

CLINICAL DATA: Increasing lethargy for 12 hours. Fall today. Recent
urinary tract infection.

EXAM:
CT HEAD WITHOUT CONTRAST
TECHNIQUE: Contiguous axial images were obtained from the base of the skull
through the vertex without intravenous contrast.

[Series 2: head without · axial · non-contrast · 0.41mm/px · z∈[-109,+11]mm · 7 of 33 slices shown, 9 images]
[im 5/33  brain]
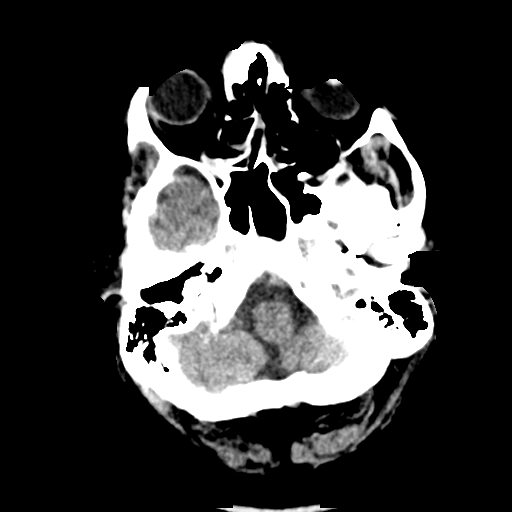
[im 5/33  bone]
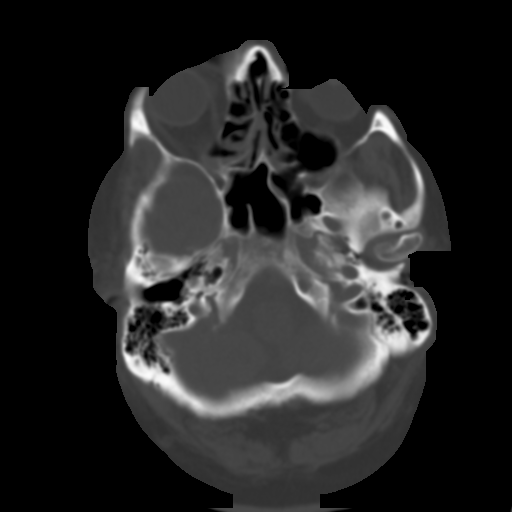
[im 9/33  brain]
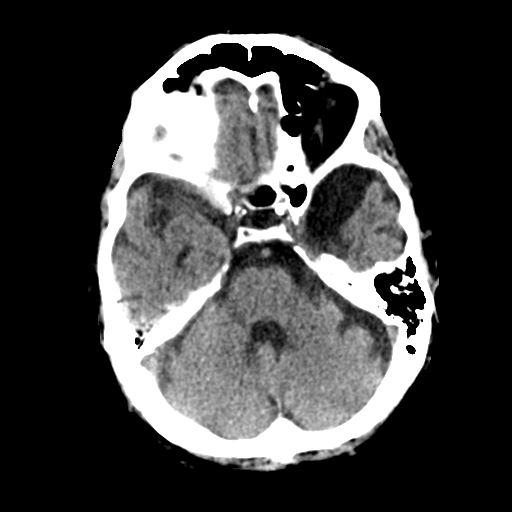
[im 13/33  brain]
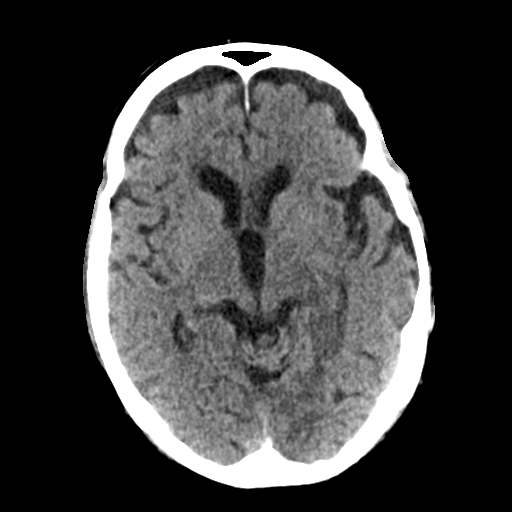
[im 17/33  brain]
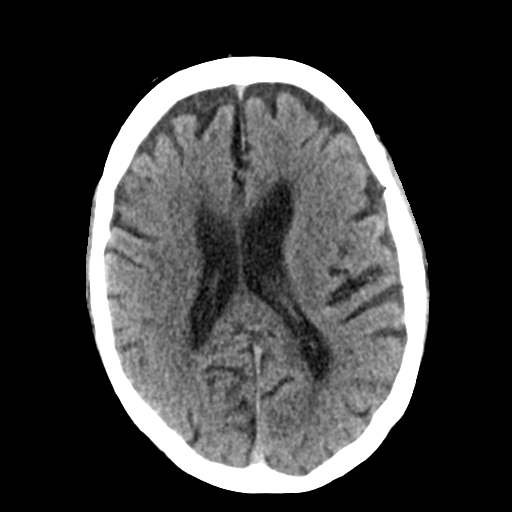
[im 21/33  brain]
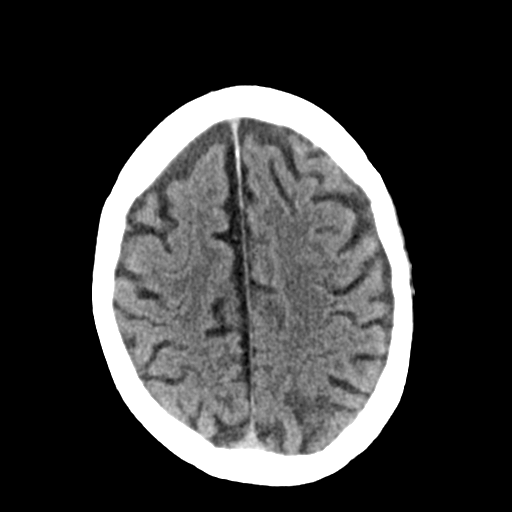
[im 21/33  bone]
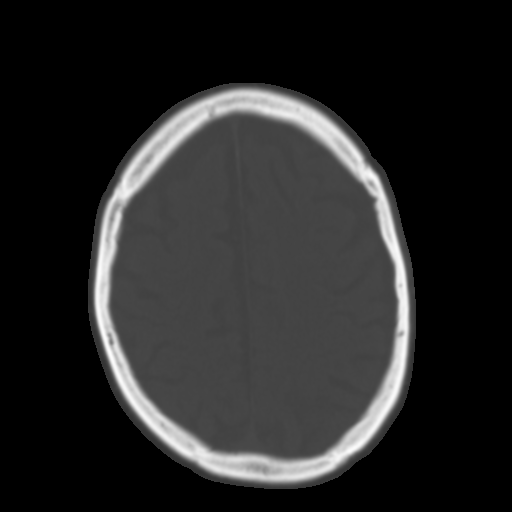
[im 25/33  brain]
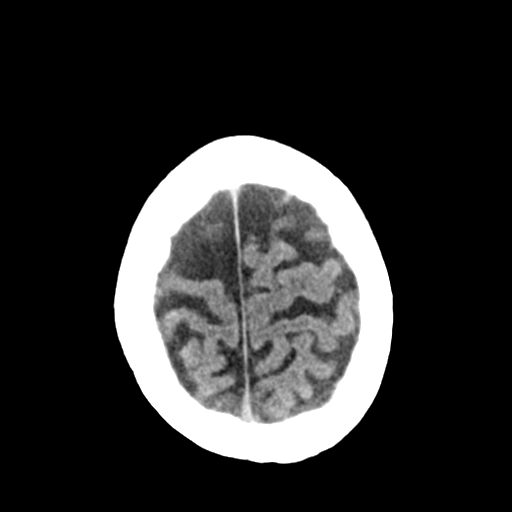
[im 29/33  brain]
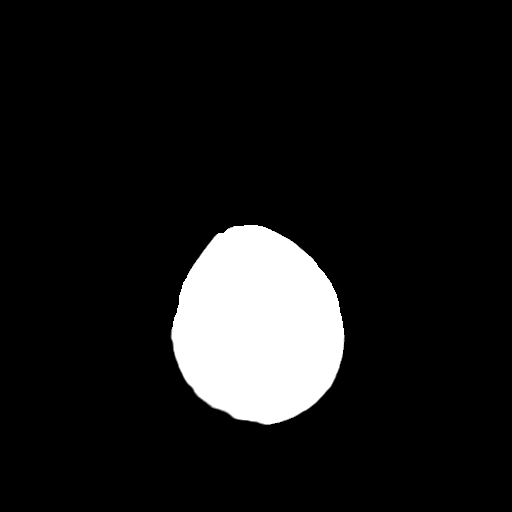

[Series 3: head bone · axial · 0.41mm/px · z∈[-113,-97]mm · 2 of 82 slices shown]
[im 9/82  bone]
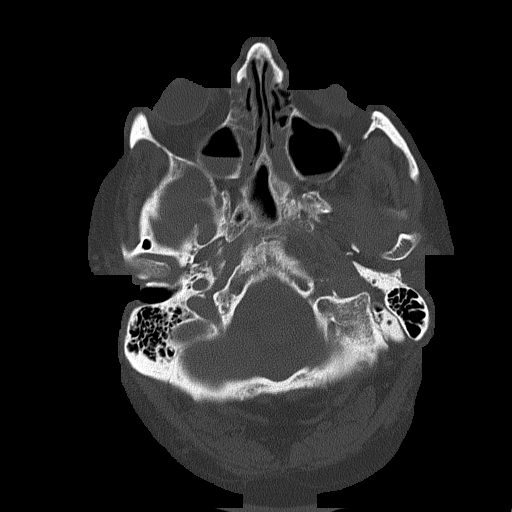
[im 17/82  bone]
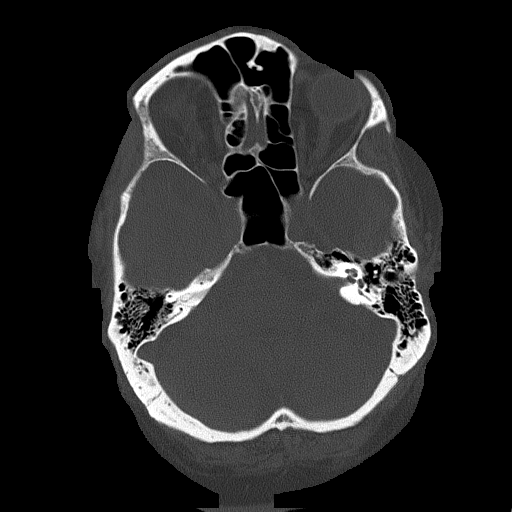

[Series 4: head without cor · coronal · non-contrast · 0.31mm/px · 3 of 68 slices shown]
[im 23/68  brain]
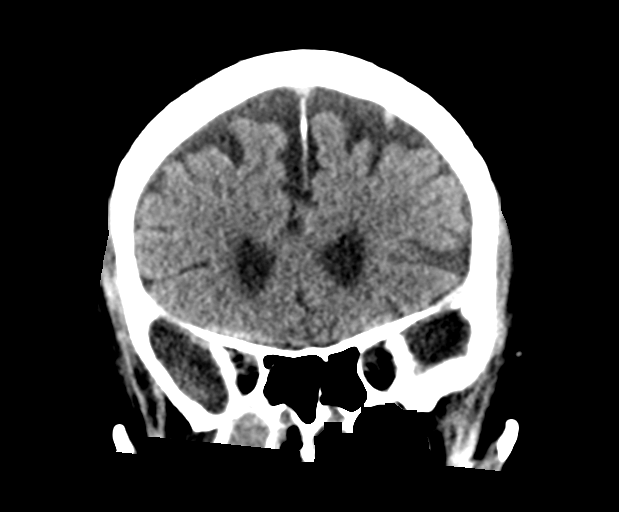
[im 30/68  brain]
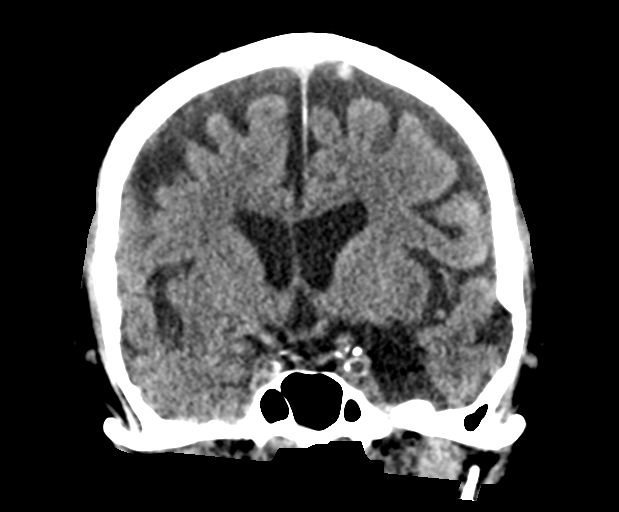
[im 38/68  brain]
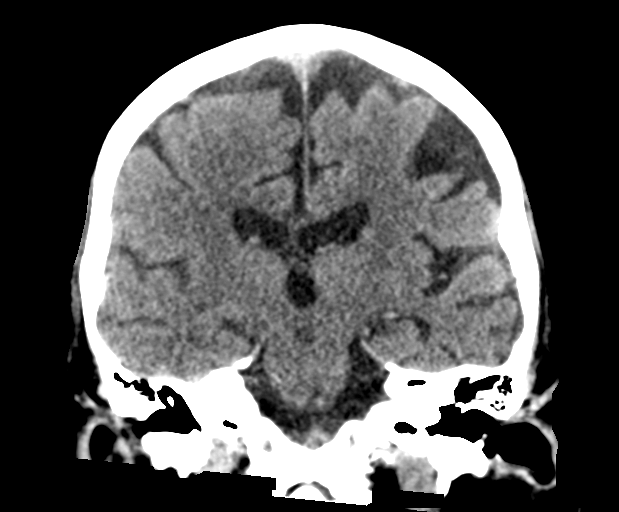

[Series 6: head without sag · sagittal · non-contrast · 0.32mm/px · 3 of 48 slices shown]
[im 17/48  brain]
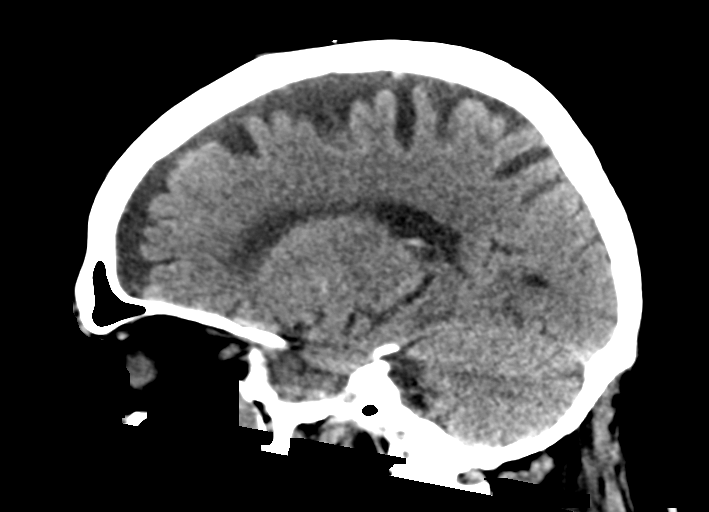
[im 24/48  brain]
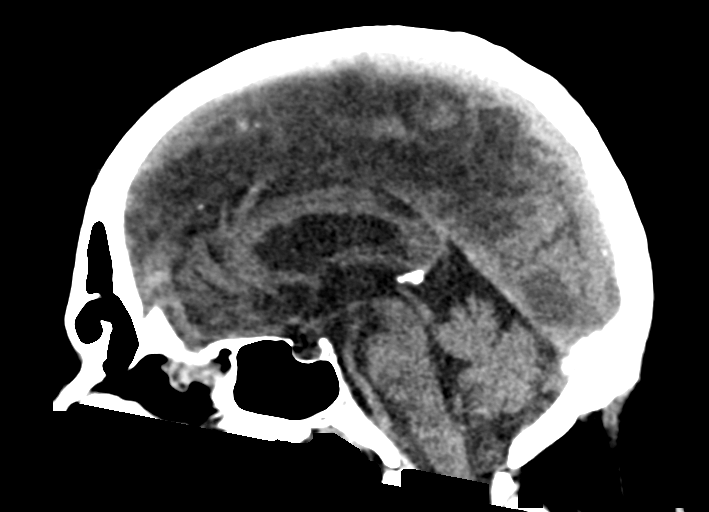
[im 32/48  brain]
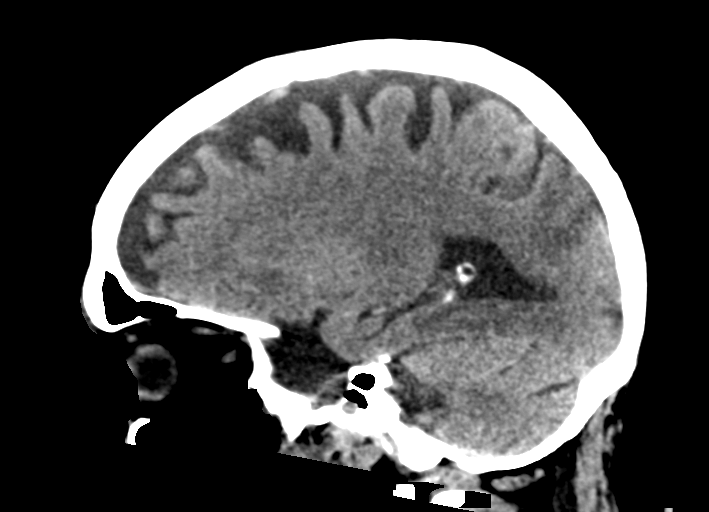

[15 of 47 positions shown; findings below may reference images not displayed]

FINDINGS: Brain: There is no evidence of acute intracranial hemorrhage, mass
lesion or acute extra-axial fluid collection. There is a possible
arachnoid cyst in the left middle cranial fossa. The ventricles and
subarachnoid spaces are diffusely prominent, consistent with
atrophy. There is ill-defined low-density in the left parietal
occipital lobes suspicious for a subacute stroke in the left PCA
distribution. There are mild chronic small vessel ischemic changes
in the periventricular white matter. There is no hydrocephalus.
Intracranial vascular calcifications are present.

Bones/sinuses/visualized face: There is mucosal thickening in the
maxillary and ethmoid sinuses bilaterally with a possible right
maxillary sinus air-fluid level. The visualized paranasal sinuses,
mastoid air cells and middle ears are otherwise clear. The calvarium
is intact.
IMPRESSION: 1. Ill-defined low-density in the left parietal occipital lobe
suspicious for a subacute nonhemorrhagic stroke in the PCA
distribution.
2. Atrophy and mild chronic small vessel ischemic changes.
3. Paranasal sinus mucosal thickening with possible right maxillary
sinus air-fluid level.

## 2016-11-10 IMAGING — CR DG CHEST 1V PORT
1 series · 1 of 1 positions shown · non-contrast
Comparison: None.

CLINICAL DATA: Acute mental status change

EXAM:
PORTABLE CHEST 1 VIEW

[AP]
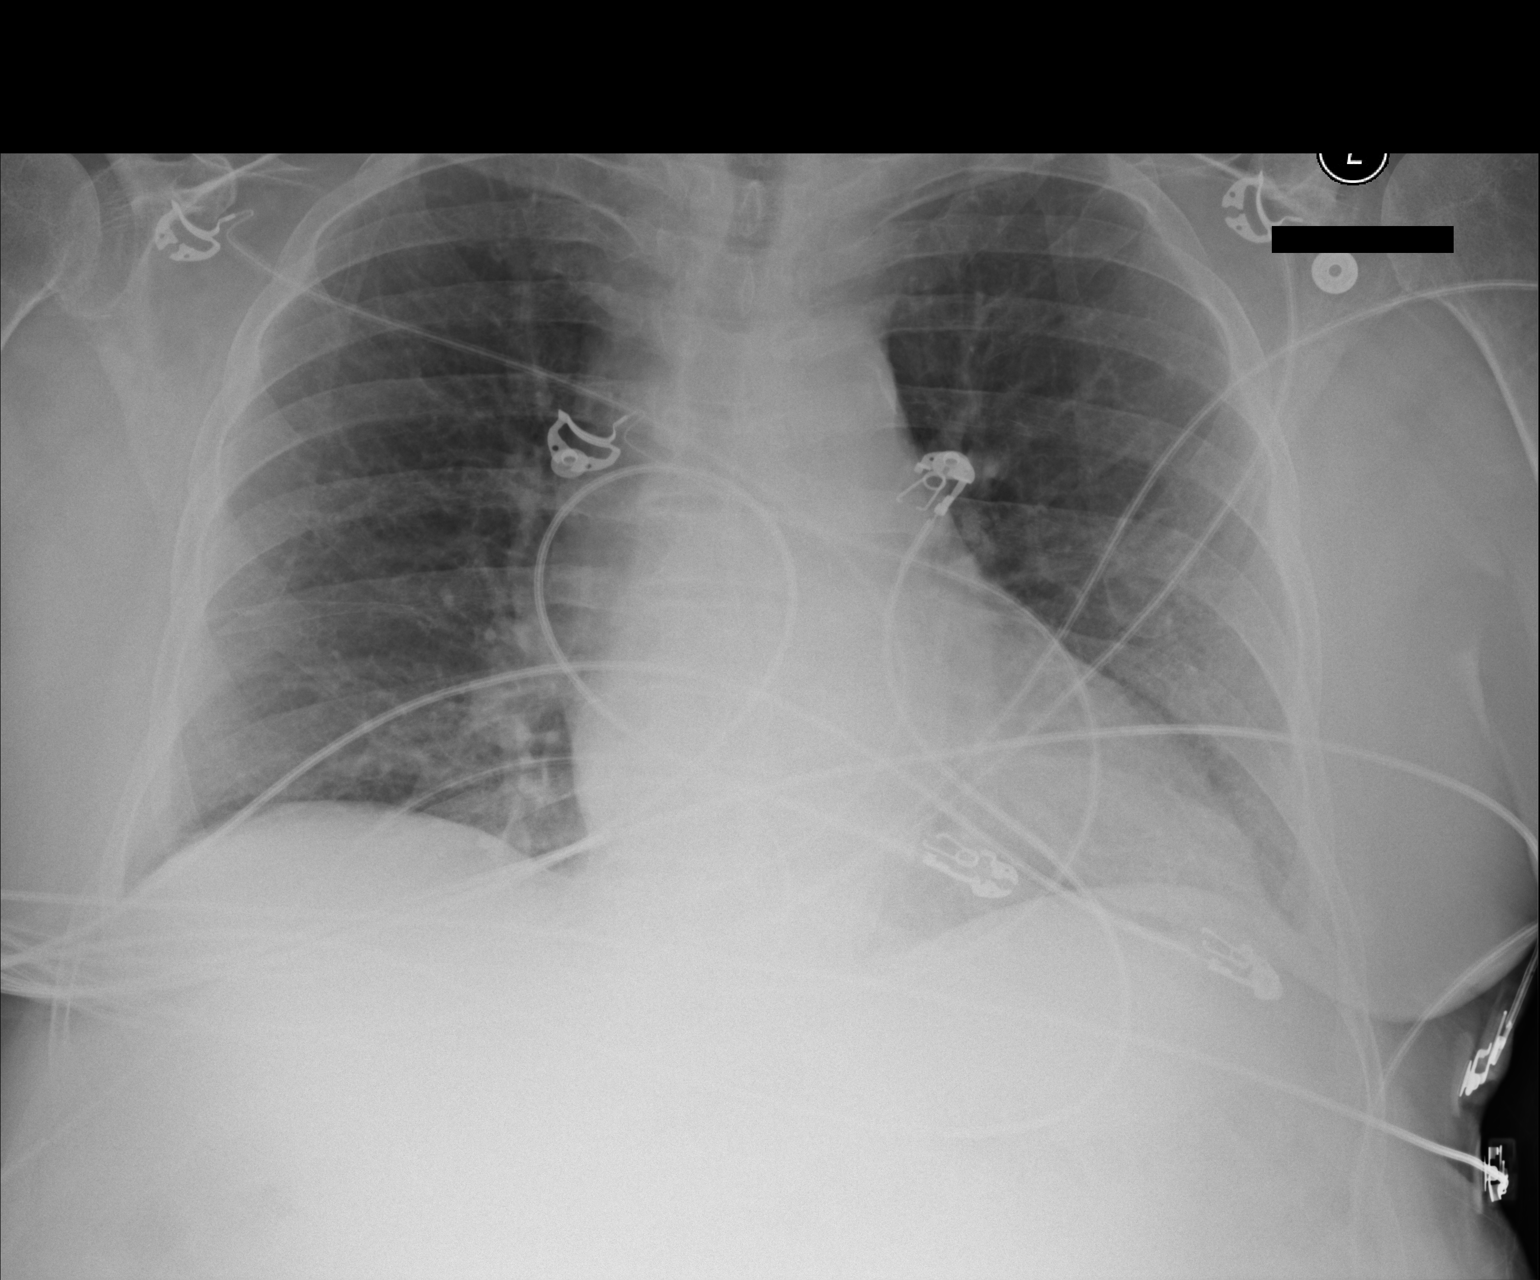

[1 of 1 positions shown; findings below may reference images not displayed]

FINDINGS: The heart size and mediastinal contours are within normal limits.
Both lungs are clear. The visualized skeletal structures are
unremarkable.
IMPRESSION: No active disease.

## 2016-11-15 DIAGNOSIS — E119 Type 2 diabetes mellitus without complications: Secondary | ICD-10-CM | POA: Diagnosis not present

## 2016-11-15 DIAGNOSIS — Z794 Long term (current) use of insulin: Secondary | ICD-10-CM | POA: Diagnosis not present

## 2016-11-15 DIAGNOSIS — H2513 Age-related nuclear cataract, bilateral: Secondary | ICD-10-CM | POA: Diagnosis not present

## 2016-11-15 DIAGNOSIS — H353132 Nonexudative age-related macular degeneration, bilateral, intermediate dry stage: Secondary | ICD-10-CM | POA: Diagnosis not present

## 2016-12-06 DIAGNOSIS — F0151 Vascular dementia with behavioral disturbance: Secondary | ICD-10-CM | POA: Diagnosis not present

## 2016-12-06 DIAGNOSIS — F321 Major depressive disorder, single episode, moderate: Secondary | ICD-10-CM | POA: Diagnosis not present

## 2016-12-06 DIAGNOSIS — F0281 Dementia in other diseases classified elsewhere with behavioral disturbance: Secondary | ICD-10-CM | POA: Diagnosis not present

## 2016-12-28 DIAGNOSIS — E1169 Type 2 diabetes mellitus with other specified complication: Secondary | ICD-10-CM | POA: Diagnosis not present

## 2016-12-28 DIAGNOSIS — I4891 Unspecified atrial fibrillation: Secondary | ICD-10-CM | POA: Diagnosis not present

## 2016-12-28 DIAGNOSIS — I110 Hypertensive heart disease with heart failure: Secondary | ICD-10-CM | POA: Diagnosis not present

## 2016-12-28 DIAGNOSIS — E785 Hyperlipidemia, unspecified: Secondary | ICD-10-CM | POA: Diagnosis not present

## 2017-01-18 DIAGNOSIS — Z79899 Other long term (current) drug therapy: Secondary | ICD-10-CM | POA: Diagnosis not present

## 2017-01-31 DIAGNOSIS — F321 Major depressive disorder, single episode, moderate: Secondary | ICD-10-CM | POA: Diagnosis not present

## 2017-01-31 DIAGNOSIS — F0281 Dementia in other diseases classified elsewhere with behavioral disturbance: Secondary | ICD-10-CM | POA: Diagnosis not present

## 2017-01-31 DIAGNOSIS — G4700 Insomnia, unspecified: Secondary | ICD-10-CM | POA: Diagnosis not present

## 2017-01-31 DIAGNOSIS — F0151 Vascular dementia with behavioral disturbance: Secondary | ICD-10-CM | POA: Diagnosis not present

## 2017-02-01 DIAGNOSIS — B351 Tinea unguium: Secondary | ICD-10-CM | POA: Diagnosis not present

## 2017-02-01 DIAGNOSIS — M79675 Pain in left toe(s): Secondary | ICD-10-CM | POA: Diagnosis not present

## 2017-02-01 DIAGNOSIS — M79674 Pain in right toe(s): Secondary | ICD-10-CM | POA: Diagnosis not present

## 2017-02-06 DIAGNOSIS — I110 Hypertensive heart disease with heart failure: Secondary | ICD-10-CM | POA: Diagnosis not present

## 2017-02-06 DIAGNOSIS — E1159 Type 2 diabetes mellitus with other circulatory complications: Secondary | ICD-10-CM | POA: Diagnosis not present

## 2017-02-06 DIAGNOSIS — I10 Essential (primary) hypertension: Secondary | ICD-10-CM | POA: Diagnosis not present

## 2017-02-06 DIAGNOSIS — E118 Type 2 diabetes mellitus with unspecified complications: Secondary | ICD-10-CM | POA: Diagnosis not present

## 2017-03-10 DIAGNOSIS — F0390 Unspecified dementia without behavioral disturbance: Secondary | ICD-10-CM | POA: Diagnosis not present

## 2017-03-10 DIAGNOSIS — I4891 Unspecified atrial fibrillation: Secondary | ICD-10-CM | POA: Diagnosis not present

## 2017-03-10 DIAGNOSIS — I110 Hypertensive heart disease with heart failure: Secondary | ICD-10-CM | POA: Diagnosis not present

## 2017-03-10 DIAGNOSIS — E1169 Type 2 diabetes mellitus with other specified complication: Secondary | ICD-10-CM | POA: Diagnosis not present

## 2017-03-21 DIAGNOSIS — F321 Major depressive disorder, single episode, moderate: Secondary | ICD-10-CM | POA: Diagnosis not present

## 2017-03-21 DIAGNOSIS — G4700 Insomnia, unspecified: Secondary | ICD-10-CM | POA: Diagnosis not present

## 2017-03-21 DIAGNOSIS — F0150 Vascular dementia without behavioral disturbance: Secondary | ICD-10-CM | POA: Diagnosis not present

## 2017-04-19 DIAGNOSIS — B351 Tinea unguium: Secondary | ICD-10-CM | POA: Diagnosis not present

## 2017-04-19 DIAGNOSIS — M79674 Pain in right toe(s): Secondary | ICD-10-CM | POA: Diagnosis not present

## 2017-04-19 DIAGNOSIS — M79675 Pain in left toe(s): Secondary | ICD-10-CM | POA: Diagnosis not present

## 2017-04-28 DIAGNOSIS — I350 Nonrheumatic aortic (valve) stenosis: Secondary | ICD-10-CM | POA: Diagnosis not present

## 2017-04-28 DIAGNOSIS — I4891 Unspecified atrial fibrillation: Secondary | ICD-10-CM | POA: Diagnosis not present

## 2017-04-28 DIAGNOSIS — F0390 Unspecified dementia without behavioral disturbance: Secondary | ICD-10-CM | POA: Diagnosis not present

## 2017-04-28 DIAGNOSIS — I110 Hypertensive heart disease with heart failure: Secondary | ICD-10-CM | POA: Diagnosis not present

## 2017-05-22 DIAGNOSIS — F0151 Vascular dementia with behavioral disturbance: Secondary | ICD-10-CM | POA: Diagnosis not present

## 2017-05-22 DIAGNOSIS — G4700 Insomnia, unspecified: Secondary | ICD-10-CM | POA: Diagnosis not present

## 2017-05-22 DIAGNOSIS — F321 Major depressive disorder, single episode, moderate: Secondary | ICD-10-CM | POA: Diagnosis not present

## 2017-05-22 DIAGNOSIS — F0281 Dementia in other diseases classified elsewhere with behavioral disturbance: Secondary | ICD-10-CM | POA: Diagnosis not present

## 2017-06-21 DIAGNOSIS — E118 Type 2 diabetes mellitus with unspecified complications: Secondary | ICD-10-CM | POA: Diagnosis not present

## 2017-06-21 DIAGNOSIS — I110 Hypertensive heart disease with heart failure: Secondary | ICD-10-CM | POA: Diagnosis not present

## 2017-06-21 DIAGNOSIS — F0390 Unspecified dementia without behavioral disturbance: Secondary | ICD-10-CM | POA: Diagnosis not present

## 2017-06-21 DIAGNOSIS — I4891 Unspecified atrial fibrillation: Secondary | ICD-10-CM | POA: Diagnosis not present

## 2017-07-19 DIAGNOSIS — I4891 Unspecified atrial fibrillation: Secondary | ICD-10-CM | POA: Diagnosis not present

## 2017-07-19 DIAGNOSIS — F0390 Unspecified dementia without behavioral disturbance: Secondary | ICD-10-CM | POA: Diagnosis not present

## 2017-07-19 DIAGNOSIS — E118 Type 2 diabetes mellitus with unspecified complications: Secondary | ICD-10-CM | POA: Diagnosis not present

## 2017-07-19 DIAGNOSIS — I110 Hypertensive heart disease with heart failure: Secondary | ICD-10-CM | POA: Diagnosis not present

## 2017-10-09 DIAGNOSIS — E1169 Type 2 diabetes mellitus with other specified complication: Secondary | ICD-10-CM | POA: Diagnosis not present

## 2017-10-09 DIAGNOSIS — I4891 Unspecified atrial fibrillation: Secondary | ICD-10-CM | POA: Diagnosis not present

## 2017-10-09 DIAGNOSIS — E785 Hyperlipidemia, unspecified: Secondary | ICD-10-CM | POA: Diagnosis not present

## 2017-10-09 DIAGNOSIS — F0390 Unspecified dementia without behavioral disturbance: Secondary | ICD-10-CM | POA: Diagnosis not present

## 2017-12-04 DIAGNOSIS — H353132 Nonexudative age-related macular degeneration, bilateral, intermediate dry stage: Secondary | ICD-10-CM | POA: Diagnosis not present

## 2017-12-04 DIAGNOSIS — H2513 Age-related nuclear cataract, bilateral: Secondary | ICD-10-CM | POA: Diagnosis not present

## 2017-12-04 DIAGNOSIS — Z7901 Long term (current) use of anticoagulants: Secondary | ICD-10-CM | POA: Diagnosis not present

## 2017-12-04 DIAGNOSIS — E119 Type 2 diabetes mellitus without complications: Secondary | ICD-10-CM | POA: Diagnosis not present

## 2019-09-04 DEATH — deceased
